# Patient Record
Sex: Female | Born: 1980
Health system: Southern US, Community
[De-identification: ages and names within clinical notes are randomized; demographics above are authoritative.]

## PROBLEM LIST (undated history)

## (undated) DIAGNOSIS — Z8742 Personal history of other diseases of the female genital tract: Secondary | ICD-10-CM

## (undated) DIAGNOSIS — IMO0002 Reserved for concepts with insufficient information to code with codable children: Secondary | ICD-10-CM

## (undated) DIAGNOSIS — D649 Anemia, unspecified: Secondary | ICD-10-CM

## (undated) DIAGNOSIS — C439 Malignant melanoma of skin, unspecified: Secondary | ICD-10-CM

## (undated) DIAGNOSIS — D259 Leiomyoma of uterus, unspecified: Secondary | ICD-10-CM

## (undated) DIAGNOSIS — R87619 Unspecified abnormal cytological findings in specimens from cervix uteri: Secondary | ICD-10-CM

## (undated) DIAGNOSIS — L309 Dermatitis, unspecified: Secondary | ICD-10-CM

## (undated) DIAGNOSIS — Z8619 Personal history of other infectious and parasitic diseases: Secondary | ICD-10-CM

## (undated) DIAGNOSIS — G43909 Migraine, unspecified, not intractable, without status migrainosus: Secondary | ICD-10-CM

## (undated) DIAGNOSIS — N9989 Other postprocedural complications and disorders of genitourinary system: Secondary | ICD-10-CM

## (undated) DIAGNOSIS — D500 Iron deficiency anemia secondary to blood loss (chronic): Secondary | ICD-10-CM

## (undated) DIAGNOSIS — D229 Melanocytic nevi, unspecified: Secondary | ICD-10-CM

## (undated) DIAGNOSIS — R51 Headache: Secondary | ICD-10-CM

## (undated) HISTORY — DX: Personal history of other infectious and parasitic diseases: Z86.19

## (undated) HISTORY — DX: Other postprocedural complications and disorders of genitourinary system: N99.89

## (undated) HISTORY — PX: ANKLE SURGERY: SHX546

## (undated) HISTORY — DX: Reserved for concepts with insufficient information to code with codable children: IMO0002

## (undated) HISTORY — DX: Unspecified abnormal cytological findings in specimens from cervix uteri: R87.619

## (undated) HISTORY — DX: Headache: R51

## (undated) HISTORY — PX: DILATION AND CURETTAGE OF UTERUS: SHX78

## (undated) HISTORY — PX: OTHER SURGICAL HISTORY: SHX169

## (undated) HISTORY — DX: Anemia, unspecified: D64.9

---
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
## 1999-11-06 ENCOUNTER — Emergency Department (HOSPITAL_COMMUNITY): Admission: EM | Admit: 1999-11-06 | Discharge: 1999-11-06 | Payer: Self-pay | Admitting: *Deleted

## 2000-02-16 ENCOUNTER — Other Ambulatory Visit: Admission: RE | Admit: 2000-02-16 | Discharge: 2000-02-16 | Payer: Self-pay | Admitting: Obstetrics and Gynecology

## 2000-05-22 ENCOUNTER — Other Ambulatory Visit: Admission: RE | Admit: 2000-05-22 | Discharge: 2000-05-22 | Payer: Self-pay | Admitting: Obstetrics and Gynecology

## 2001-04-02 ENCOUNTER — Other Ambulatory Visit: Admission: RE | Admit: 2001-04-02 | Discharge: 2001-04-02 | Payer: Self-pay | Admitting: Obstetrics and Gynecology

## 2003-08-17 ENCOUNTER — Other Ambulatory Visit: Admission: RE | Admit: 2003-08-17 | Discharge: 2003-08-17 | Payer: Self-pay | Admitting: Obstetrics and Gynecology

## 2008-08-14 ENCOUNTER — Emergency Department (HOSPITAL_COMMUNITY): Admission: EM | Admit: 2008-08-14 | Discharge: 2008-08-14 | Payer: Self-pay | Admitting: Emergency Medicine

## 2008-08-14 HISTORY — PX: ANKLE SURGERY: SHX546

## 2008-11-12 HISTORY — PX: DILATION AND EVACUATION: SHX1459

## 2009-06-10 ENCOUNTER — Ambulatory Visit (HOSPITAL_COMMUNITY): Admission: RE | Admit: 2009-06-10 | Discharge: 2009-06-10 | Payer: Self-pay | Admitting: Obstetrics

## 2009-06-17 HISTORY — PX: DILATION AND EVACUATION: SHX1459

## 2009-06-19 ENCOUNTER — Ambulatory Visit (HOSPITAL_COMMUNITY): Admission: RE | Admit: 2009-06-19 | Discharge: 2009-06-19 | Payer: Self-pay | Admitting: Obstetrics

## 2009-06-29 ENCOUNTER — Ambulatory Visit (HOSPITAL_COMMUNITY): Admission: RE | Admit: 2009-06-29 | Discharge: 2009-06-29 | Payer: Self-pay | Admitting: Obstetrics

## 2009-11-26 ENCOUNTER — Inpatient Hospital Stay (HOSPITAL_COMMUNITY): Admission: AD | Admit: 2009-11-26 | Discharge: 2009-11-27 | Payer: Self-pay | Admitting: Obstetrics & Gynecology

## 2009-12-22 ENCOUNTER — Ambulatory Visit: Payer: Self-pay | Admitting: Advanced Practice Midwife

## 2009-12-22 ENCOUNTER — Inpatient Hospital Stay (HOSPITAL_COMMUNITY): Admission: AD | Admit: 2009-12-22 | Discharge: 2009-12-23 | Payer: Self-pay | Admitting: Obstetrics and Gynecology

## 2010-07-15 ENCOUNTER — Inpatient Hospital Stay (HOSPITAL_COMMUNITY): Admission: AD | Admit: 2010-07-15 | Discharge: 2010-07-15 | Payer: Self-pay | Admitting: Obstetrics

## 2010-07-15 ENCOUNTER — Ambulatory Visit: Payer: Self-pay | Admitting: Gynecology

## 2010-07-15 DIAGNOSIS — O479 False labor, unspecified: Secondary | ICD-10-CM

## 2010-07-16 ENCOUNTER — Inpatient Hospital Stay (HOSPITAL_COMMUNITY): Admission: AD | Admit: 2010-07-16 | Discharge: 2010-07-19 | Payer: Self-pay | Admitting: Obstetrics

## 2010-07-16 ENCOUNTER — Inpatient Hospital Stay (HOSPITAL_COMMUNITY): Admission: AD | Admit: 2010-07-16 | Discharge: 2010-07-16 | Payer: Self-pay | Admitting: Obstetrics and Gynecology

## 2010-07-23 ENCOUNTER — Ambulatory Visit: Payer: Self-pay | Admitting: Family

## 2010-07-23 ENCOUNTER — Inpatient Hospital Stay (HOSPITAL_COMMUNITY): Admission: AD | Admit: 2010-07-23 | Discharge: 2010-07-23 | Payer: Self-pay | Admitting: Obstetrics

## 2010-07-26 ENCOUNTER — Ambulatory Visit: Admission: RE | Admit: 2010-07-26 | Discharge: 2010-07-26 | Payer: Self-pay | Admitting: Obstetrics

## 2010-09-22 DIAGNOSIS — D229 Melanocytic nevi, unspecified: Secondary | ICD-10-CM

## 2010-09-22 HISTORY — DX: Melanocytic nevi, unspecified: D22.9

## 2011-01-25 LAB — CBC
HCT: 34.5 % — ABNORMAL LOW (ref 36.0–46.0)
HCT: 38.7 % (ref 36.0–46.0)
Hemoglobin: 10.3 g/dL — ABNORMAL LOW (ref 12.0–15.0)
Hemoglobin: 12 g/dL (ref 12.0–15.0)
Hemoglobin: 13.4 g/dL (ref 12.0–15.0)
MCHC: 34.8 g/dL (ref 30.0–36.0)
MCV: 91.8 fL (ref 78.0–100.0)
Platelets: 176 10*3/uL (ref 150–400)
RBC: 3.18 MIL/uL — ABNORMAL LOW (ref 3.87–5.11)
RDW: 14.1 % (ref 11.5–15.5)
WBC: 10.4 10*3/uL (ref 4.0–10.5)

## 2011-01-25 LAB — RPR: RPR Ser Ql: NONREACTIVE

## 2011-01-25 LAB — URINALYSIS, ROUTINE W REFLEX MICROSCOPIC
Bilirubin Urine: NEGATIVE
Glucose, UA: NEGATIVE mg/dL
Ketones, ur: NEGATIVE mg/dL
Nitrite: NEGATIVE
Specific Gravity, Urine: 1.015 (ref 1.005–1.030)
pH: 6.5 (ref 5.0–8.0)

## 2011-01-25 LAB — URINE MICROSCOPIC-ADD ON

## 2011-01-25 LAB — TYPE AND SCREEN
ABO/RH(D): A POS
Antibody Screen: NEGATIVE

## 2011-01-28 LAB — CBC
HCT: 38.5 % (ref 36.0–46.0)
Hemoglobin: 13.1 g/dL (ref 12.0–15.0)
MCHC: 34.1 g/dL (ref 30.0–36.0)
RBC: 4.13 MIL/uL (ref 3.87–5.11)
RDW: 13.4 % (ref 11.5–15.5)

## 2011-01-28 LAB — URINALYSIS, ROUTINE W REFLEX MICROSCOPIC
Glucose, UA: NEGATIVE mg/dL
Hgb urine dipstick: NEGATIVE
Ketones, ur: 15 mg/dL — AB
Protein, ur: NEGATIVE mg/dL
Urobilinogen, UA: 0.2 mg/dL (ref 0.0–1.0)

## 2011-11-13 NOTE — L&D Delivery Note (Signed)
Delivery Note At 6:47 PM a viable female was delivered via Vaginal, Spontaneous Delivery (Presentation: ; DOA ). Head delivered, nuchal x 1 noted, loose but unable to be reduced. Cord clamped x 2 and cut. Shoulders delivered easily APGAR: 8/9 , ; weight .  pending Placenta status: spontaneous, intace , .  Cord:  3VCwith the following complications: none.  Cord pH: not indicated  Anesthesia: Epidural  Episiotomy: noine Lacerations: none. Small periurethral abrasion and hematoma, approaching but not affecting urethral meatus Suture Repair: none Est. Blood Loss (mL):   Mom to postpartum.  Baby to nursery-stable Mom w/ h/o PPH secondary to urinary retention, will keep close watch on Uout and plan foley if not voiding well.  Ande Therrell A. 07/21/2012, 7:00 PM

## 2011-12-18 LAB — OB RESULTS CONSOLE ABO/RH

## 2011-12-18 LAB — OB RESULTS CONSOLE HIV ANTIBODY (ROUTINE TESTING): HIV: NONREACTIVE

## 2011-12-18 LAB — OB RESULTS CONSOLE HEPATITIS B SURFACE ANTIGEN: Hepatitis B Surface Ag: NEGATIVE

## 2011-12-20 LAB — OB RESULTS CONSOLE GC/CHLAMYDIA: Chlamydia: NEGATIVE

## 2012-06-30 LAB — OB RESULTS CONSOLE GBS: GBS: NEGATIVE

## 2012-07-16 ENCOUNTER — Telehealth (HOSPITAL_COMMUNITY): Payer: Self-pay | Admitting: *Deleted

## 2012-07-16 ENCOUNTER — Encounter (HOSPITAL_COMMUNITY): Payer: Self-pay | Admitting: *Deleted

## 2012-07-16 NOTE — Telephone Encounter (Signed)
Preadmission screen  

## 2012-07-17 ENCOUNTER — Other Ambulatory Visit: Payer: Self-pay | Admitting: Obstetrics

## 2012-07-18 ENCOUNTER — Encounter (HOSPITAL_COMMUNITY): Payer: Self-pay | Admitting: *Deleted

## 2012-07-18 ENCOUNTER — Inpatient Hospital Stay (HOSPITAL_COMMUNITY)
Admission: AD | Admit: 2012-07-18 | Discharge: 2012-07-18 | Disposition: A | Discharge status: Home / Self Care | Payer: BC Managed Care – PPO | Source: Ambulatory Visit | Attending: Obstetrics and Gynecology | Admitting: Obstetrics and Gynecology

## 2012-07-18 DIAGNOSIS — O479 False labor, unspecified: Secondary | ICD-10-CM | POA: Insufficient documentation

## 2012-07-18 NOTE — MAU Note (Signed)
Contractions q 5 minutes 

## 2012-07-18 NOTE — Progress Notes (Signed)
Pt states she has a headache that she rates a 4 

## 2012-07-18 NOTE — Progress Notes (Signed)
Dr Cherly Hensen informed of pt's presence on the unit. VE exam. Order recieved

## 2012-07-21 ENCOUNTER — Inpatient Hospital Stay (HOSPITAL_COMMUNITY): Payer: BC Managed Care – PPO | Admitting: Anesthesiology

## 2012-07-21 ENCOUNTER — Encounter (HOSPITAL_COMMUNITY): Payer: Self-pay | Admitting: Anesthesiology

## 2012-07-21 ENCOUNTER — Encounter (HOSPITAL_COMMUNITY): Payer: Self-pay

## 2012-07-21 ENCOUNTER — Inpatient Hospital Stay (HOSPITAL_COMMUNITY)
Admission: RE | Admit: 2012-07-21 | Discharge: 2012-07-23 | DRG: 373 | Disposition: A | Discharge status: Home / Self Care | Payer: BC Managed Care – PPO | Source: Ambulatory Visit | Attending: Obstetrics | Admitting: Obstetrics

## 2012-07-21 DIAGNOSIS — O480 Post-term pregnancy: Principal | ICD-10-CM | POA: Diagnosis present

## 2012-07-21 LAB — CBC
HCT: 36.3 % (ref 36.0–46.0)
MCHC: 33.3 g/dL (ref 30.0–36.0)
Platelets: 199 10*3/uL (ref 150–400)
RDW: 14.2 % (ref 11.5–15.5)
WBC: 9.2 10*3/uL (ref 4.0–10.5)

## 2012-07-21 LAB — TYPE AND SCREEN
ABO/RH(D): A POS
Antibody Screen: NEGATIVE

## 2012-07-21 MED ORDER — TERBUTALINE SULFATE 1 MG/ML IJ SOLN: 0.2500 mg | Freq: Once | INTRAMUSCULAR | Status: DC | PRN

## 2012-07-21 MED ORDER — DIBUCAINE 1 % RE OINT: 1.0000 "application " | TOPICAL_OINTMENT | RECTAL | Status: DC | PRN

## 2012-07-21 MED ORDER — PHENYLEPHRINE 40 MCG/ML (10ML) SYRINGE FOR IV PUSH (FOR BLOOD PRESSURE SUPPORT): 80.0000 ug | PREFILLED_SYRINGE | INTRAVENOUS | Status: DC | PRN

## 2012-07-21 MED ORDER — SENNOSIDES-DOCUSATE SODIUM 8.6-50 MG PO TABS
2.0000 | ORAL_TABLET | Freq: Every day | ORAL | Status: DC
  Administered 2012-07-21 – 2012-07-22 (×2): 2 via ORAL

## 2012-07-21 MED ORDER — ONDANSETRON HCL 4 MG PO TABS: 4.0000 mg | ORAL_TABLET | ORAL | Status: DC | PRN

## 2012-07-21 MED ORDER — WITCH HAZEL-GLYCERIN EX PADS: 1.0000 "application " | MEDICATED_PAD | CUTANEOUS | Status: DC | PRN

## 2012-07-21 MED ORDER — LIDOCAINE HCL (PF) 1 % IJ SOLN: INTRAMUSCULAR | Status: DC | PRN

## 2012-07-21 MED ORDER — BISACODYL 10 MG RE SUPP: 10.0000 mg | Freq: Every day | RECTAL | Status: DC | PRN

## 2012-07-21 MED ORDER — LACTATED RINGERS IV SOLN
INTRAVENOUS | Status: DC
  Administered 2012-07-21 (×3): via INTRAVENOUS

## 2012-07-21 MED ORDER — PHENYLEPHRINE 40 MCG/ML (10ML) SYRINGE FOR IV PUSH (FOR BLOOD PRESSURE SUPPORT)
80.0000 ug | PREFILLED_SYRINGE | INTRAVENOUS | Status: DC | PRN
  Filled 2012-07-21: qty 5

## 2012-07-21 MED ORDER — OXYTOCIN 40 UNITS IN LACTATED RINGERS INFUSION - SIMPLE MED
62.5000 mL/h | Freq: Once | INTRAVENOUS | Status: AC
  Administered 2012-07-21: 62.5 mL/h via INTRAVENOUS
  Filled 2012-07-21: qty 1000

## 2012-07-21 MED ORDER — IBUPROFEN 600 MG PO TABS
600.0000 mg | ORAL_TABLET | Freq: Four times a day (QID) | ORAL | Status: DC
  Administered 2012-07-21 – 2012-07-22 (×2): 600 mg via ORAL
  Filled 2012-07-21 (×2): qty 1

## 2012-07-21 MED ORDER — EPHEDRINE 5 MG/ML INJ
10.0000 mg | INTRAVENOUS | Status: DC | PRN
  Filled 2012-07-21: qty 4

## 2012-07-21 MED ORDER — LIDOCAINE HCL (PF) 1 % IJ SOLN
INTRAMUSCULAR | Status: DC | PRN
  Administered 2012-07-21 (×2): 9 mL

## 2012-07-21 MED ORDER — ONDANSETRON HCL 4 MG/2ML IJ SOLN: 4.0000 mg | Freq: Four times a day (QID) | INTRAMUSCULAR | Status: DC | PRN

## 2012-07-21 MED ORDER — OXYTOCIN BOLUS FROM INFUSION
500.0000 mL | Freq: Once | INTRAVENOUS | Status: DC
  Filled 2012-07-21: qty 500

## 2012-07-21 MED ORDER — TETANUS-DIPHTH-ACELL PERTUSSIS 5-2.5-18.5 LF-MCG/0.5 IM SUSP: 0.5000 mL | Freq: Once | INTRAMUSCULAR | Status: DC

## 2012-07-21 MED ORDER — LACTATED RINGERS IV SOLN: 500.0000 mL | INTRAVENOUS | Status: DC | PRN

## 2012-07-21 MED ORDER — IBUPROFEN 600 MG PO TABS: 600.0000 mg | ORAL_TABLET | Freq: Four times a day (QID) | ORAL | Status: DC | PRN

## 2012-07-21 MED ORDER — ONDANSETRON HCL 4 MG/2ML IJ SOLN: 4.0000 mg | INTRAMUSCULAR | Status: DC | PRN

## 2012-07-21 MED ORDER — OXYTOCIN 40 UNITS IN LACTATED RINGERS INFUSION - SIMPLE MED
1.0000 m[IU]/min | INTRAVENOUS | Status: DC
  Administered 2012-07-21: 2 m[IU]/min via INTRAVENOUS
  Filled 2012-07-21: qty 1000

## 2012-07-21 MED ORDER — LANOLIN HYDROUS EX OINT: TOPICAL_OINTMENT | CUTANEOUS | Status: DC | PRN

## 2012-07-21 MED ORDER — LIDOCAINE HCL (PF) 1 % IJ SOLN
30.0000 mL | INTRAMUSCULAR | Status: DC | PRN
  Filled 2012-07-21: qty 30

## 2012-07-21 MED ORDER — CITRIC ACID-SODIUM CITRATE 334-500 MG/5ML PO SOLN: 30.0000 mL | ORAL | Status: DC | PRN

## 2012-07-21 MED ORDER — SIMETHICONE 80 MG PO CHEW: 80.0000 mg | CHEWABLE_TABLET | ORAL | Status: DC | PRN

## 2012-07-21 MED ORDER — OXYTOCIN 40 UNITS IN LACTATED RINGERS INFUSION - SIMPLE MED: 62.5000 mL/h | INTRAVENOUS | Status: DC

## 2012-07-21 MED ORDER — DIPHENHYDRAMINE HCL 50 MG/ML IJ SOLN: 12.5000 mg | INTRAMUSCULAR | Status: DC | PRN

## 2012-07-21 MED ORDER — DIPHENHYDRAMINE HCL 25 MG PO CAPS: 25.0000 mg | ORAL_CAPSULE | Freq: Four times a day (QID) | ORAL | Status: DC | PRN

## 2012-07-21 MED ORDER — OXYCODONE-ACETAMINOPHEN 5-325 MG PO TABS: 1.0000 | ORAL_TABLET | ORAL | Status: DC | PRN

## 2012-07-21 MED ORDER — ACETAMINOPHEN 325 MG PO TABS: 650.0000 mg | ORAL_TABLET | ORAL | Status: DC | PRN

## 2012-07-21 MED ORDER — FLEET ENEMA 7-19 GM/118ML RE ENEM: 1.0000 | ENEMA | Freq: Every day | RECTAL | Status: DC | PRN

## 2012-07-21 MED ORDER — FENTANYL 2.5 MCG/ML BUPIVACAINE 1/10 % EPIDURAL INFUSION (WH - ANES)
INTRAMUSCULAR | Status: DC | PRN
  Administered 2012-07-21: 14 mL/h via EPIDURAL

## 2012-07-21 MED ORDER — ZOLPIDEM TARTRATE 5 MG PO TABS: 5.0000 mg | ORAL_TABLET | Freq: Every evening | ORAL | Status: DC | PRN

## 2012-07-21 MED ORDER — BENZOCAINE-MENTHOL 20-0.5 % EX AERO
1.0000 "application " | INHALATION_SPRAY | CUTANEOUS | Status: DC | PRN
  Filled 2012-07-21: qty 56

## 2012-07-21 MED ORDER — FLEET ENEMA 7-19 GM/118ML RE ENEM: 1.0000 | ENEMA | RECTAL | Status: DC | PRN

## 2012-07-21 MED ORDER — PRENATAL MULTIVITAMIN CH
1.0000 | ORAL_TABLET | Freq: Every day | ORAL | Status: DC
  Administered 2012-07-22 – 2012-07-23 (×2): 1 via ORAL
  Filled 2012-07-21 (×2): qty 1

## 2012-07-21 MED ORDER — FENTANYL 2.5 MCG/ML BUPIVACAINE 1/10 % EPIDURAL INFUSION (WH - ANES)
14.0000 mL/h | INTRAMUSCULAR | Status: DC
  Administered 2012-07-21 (×2): 14 mL/h via EPIDURAL
  Filled 2012-07-21 (×3): qty 60

## 2012-07-21 MED ORDER — EPHEDRINE 5 MG/ML INJ: 10.0000 mg | INTRAVENOUS | Status: DC | PRN

## 2012-07-21 MED ORDER — LACTATED RINGERS IV SOLN: 500.0000 mL | Freq: Once | INTRAVENOUS | Status: DC

## 2012-07-21 NOTE — Anesthesia Preprocedure Evaluation (Signed)
Anesthesia Evaluation  Patient identified by MRN, date of birth, ID band Patient awake    Reviewed: Allergy & Precautions, H&P , NPO status , Patient's Chart, lab work & pertinent test results  Airway Mallampati: I TM Distance: >3 FB Neck ROM: full    Dental No notable dental hx.    Pulmonary neg pulmonary ROS,  breath sounds clear to auscultation  Pulmonary exam normal       Cardiovascular negative cardio ROS      Neuro/Psych negative psych ROS   GI/Hepatic negative GI ROS, Neg liver ROS,   Endo/Other  negative endocrine ROS  Renal/GU negative Renal ROS  negative genitourinary   Musculoskeletal negative musculoskeletal ROS (+)   Abdominal Normal abdominal exam  (+)   Peds  Hematology negative hematology ROS (+)   Anesthesia Other Findings   Reproductive/Obstetrics (+) Pregnancy                           Anesthesia Physical Anesthesia Plan  ASA: II  Anesthesia Plan: Epidural   Post-op Pain Management:    Induction:   Airway Management Planned:   Additional Equipment:   Intra-op Plan:   Post-operative Plan:   Informed Consent: I have reviewed the patients History and Physical, chart, labs and discussed the procedure including the risks, benefits and alternatives for the proposed anesthesia with the patient or authorized representative who has indicated his/her understanding and acceptance.     Plan Discussed with:   Anesthesia Plan Comments:         Anesthesia Quick Evaluation

## 2012-07-21 NOTE — Anesthesia Procedure Notes (Signed)
Epidural Patient location during procedure: OB Start time: 07/21/2012 1:53 PM End time: 07/21/2012 1:57 PM  Staffing Anesthesiologist: Sandrea Hughs Performed by: anesthesiologist   Preanesthetic Checklist Completed: patient identified, site marked, surgical consent, pre-op evaluation, timeout performed, IV checked, risks and benefits discussed and monitors and equipment checked  Epidural Patient position: sitting Prep: site prepped and draped and DuraPrep Patient monitoring: continuous pulse ox and blood pressure Approach: midline Injection technique: LOR air  Needle:  Needle type: Tuohy  Needle gauge: 17 G Needle length: 9 cm and 9 Needle insertion depth: 5 cm cm Catheter type: closed end flexible Catheter size: 19 Gauge Catheter at skin depth: 10 cm Test dose: negative and Other  Assessment Sensory level: T10 Events: blood not aspirated, injection not painful, no injection resistance, negative IV test and no paresthesia  Additional Notes Reason for block:procedure for pain

## 2012-07-21 NOTE — Progress Notes (Signed)
Feeling ctx but not painful  PE: Gen: well appearing Tocoo; q 3 min, pitocin FH: 130's, + accels, no decels, 10 beat var Cvx: 3/50%/ vtx -3 but engaged, post  AROM- clear.  A/P: G4P1 at 40'3 for IOL - pitocin on, cont pit. AROM now - reactive fetal testing - planning epidural  Lindsey Palmer A. 07/21/2012 1:15 PM

## 2012-07-21 NOTE — Progress Notes (Signed)
S: Doing well, no complaints, pain well controlled with epidural, though can still feel some ctx  O: BP 136/82  Pulse 95  Temp 97.8 F (36.6 C) (Axillary)  Resp 16  Ht 6' (1.829 m)  Wt 92.987 kg (205 lb)  BMI 27.80 kg/m2  SpO2 98%  LMP 10/12/2011   FHT:  FHR: 130s bpm, variability: moderate,  accelerations:  Present,  decelerations:  Absent UC:   regular, every 4 minutes SVE:   Dilation: 3.5 Effacement (%): 60 Station: -2 Exam by:: E. [facility]one RNC   A / P:  31 y.o.  Obstetric History   G4   P1   T1   P0   A2   TAB0   SAB2   E0   M0   L1    at [redacted]w[redacted]d Induction of labor due to postterm,  progressing well on pitocin. Slower but adequate progress, pt w/ change in cervical position more than dilation, cont to increase pitocin  Fetal Wellbeing:  Category I Pain Control:  Epidural  Anticipated MOD:  NSVD  Freeman Borba A. 07/21/2012, 4:25 PM

## 2012-07-21 NOTE — H&P (Signed)
Lindsey Palmer is a 31 y.o. [id number] at [redacted]w[redacted]d presenting for IOL. Pt notes rare contractions. Good fetal movement, No vaginal bleeding, not leaking fluid.  [facility] at [facility] since 1st trimester. - increased wt gain- 46# -thin PCOS, conceived w/ Clomid/ metformin- stopped at 12 wks - h/o MAB x 2  OB History    Grav Para Term Preterm Abortions TAB SAB Ect Mult Living   4 1 1  2  2   1     SAB x 2 SVD, IOL, 41 wks, 7'15  Past Medical History  Diagnosis Date  . Abnormal Pap smear   . Headache   . Urinary complications   . H/O varicella   migraine  Past Surgical History  Procedure Date  . Ankle surgery   . Dilation and curettage of uterus    Family History: family history includes Cancer in her paternal grandfather and Migraines in her brother and mother. Social History:  reports that she has never smoked. She has never used smokeless tobacco. She reports that she drinks alcohol. She reports that she does not use illicit drugs.  Review of Systems - Negative except rare ctx  NKDA Meds: PNV, Dulolax   Last menstrual period 10/12/2011.  Physical Exam:  Gen: well appearing, no distress CV: RRR Pulm: CTAB Back: no CVAT Abd: gravid, NT, no RUQ pain LE: tr edema, equal bilaterally, non-tender EFW: 8# Toco: rare FH: baseline 130s, accelerations present, no deceleratons, 10 beat variability  Prenatal labs: ABO, Rh: A/Positive/-- (02/05 0000) Antibody:  neg Rubella:  immune RPR: Nonreactive (02/05 0000)  HBsAg: Negative (02/05 0000)  HIV: Non-reactive (02/05 0000)  GBS: Negative (08/19 0000)  1 hr Glucola 85  Genetic screening nl Anatomy US nl  Labs pending  Assessment/Plan: 31 y.o. [id number] at [redacted]w[redacted]d Reactive fetal testing IOL. Plan pitocin now, AROM when head more engaged   Juwuan Sedita A. 07/21/2012, 7:38 AM

## 2012-07-22 LAB — CBC
MCH: 29.6 pg (ref 26.0–34.0)
MCV: 88.5 fL (ref 78.0–100.0)
Platelets: 180 10*3/uL (ref 150–400)
RBC: 3.82 MIL/uL — ABNORMAL LOW (ref 3.87–5.11)
RDW: 14.1 % (ref 11.5–15.5)
WBC: 11.5 10*3/uL — ABNORMAL HIGH (ref 4.0–10.5)

## 2012-07-22 MED ORDER — HYDROMORPHONE HCL 2 MG PO TABS
1.0000 mg | ORAL_TABLET | ORAL | Status: DC | PRN
  Administered 2012-07-22 – 2012-07-23 (×5): 1 mg via ORAL
  Filled 2012-07-22 (×5): qty 1

## 2012-07-22 MED ORDER — IBUPROFEN 800 MG PO TABS
800.0000 mg | ORAL_TABLET | Freq: Three times a day (TID) | ORAL | Status: DC
  Administered 2012-07-22 – 2012-07-23 (×3): 800 mg via ORAL
  Filled 2012-07-22 (×4): qty 1

## 2012-07-22 NOTE — Addendum Note (Signed)
Addendum  created 07/22/12 1312 by Algis Greenhouse, CRNA   Modules edited:Charges VN, Notes Section

## 2012-07-22 NOTE — Addendum Note (Signed)
Addendum  created 07/22/12 1312 by Landy Mace A Matayah Reyburn, CRNA   Modules edited:Charges VN, Notes Section    

## 2012-07-22 NOTE — Progress Notes (Signed)
PPD 1 SVD  S:  Reports feeling well but having moderate cramping- pain              Tolerating po/ No nausea or vomiting             Bleeding is light             Pain controlled withprescription NSAID's including  motrin - percocet not effective / request dilaudid             Up ad lib / ambulatory / voiding QS (reminded to empty bladder every 2-4 hours while awake)  Newborn breast-feeding  / No circ   O:  A & O x 3 NAd             VS: Blood pressure 117/74, pulse 59, temperature 97.7 F (36.5 C), temperature source Oral, resp. rate 18, height 6' (1.829 m), weight 92.987 kg (205 lb), last menstrual period 10/12/2011, SpO2 95.00%, unknown if currently breastfeeding.  LABS: WBC/Hgb/Hct/Plts:  11.5/11.3/33.8/180 (09/10 0525)   Lungs: Clear and unlabored  Heart: regular rate and rhythm  Abdomen: soft, non-tender, non-distended              Fundus: firm, non-tender, U-1  Perineum: no edema / ice pack in place  Lochia: light  Extremities: no edema, no calf pain or tenderness    A: PPD # 1 SVD -intact              Ineffective pain control with percocet  Doing well - stable status  P:  Routine post partum orders             Change pain medication  Anticipate discharge tomorrow  Marlinda Mike CNM, MSN 07/22/2012, 9:23 AM

## 2012-07-22 NOTE — Anesthesia Postprocedure Evaluation (Signed)
Anesthesia Post Note  Patient: Lindsey Palmer  Procedure(s) Performed: * No procedures listed *  Anesthesia type: Epidural  Patient location: Mother/Baby  Post pain: Pain level controlled  Post assessment: Post-op Vital signs reviewed  Last Vitals:  Filed Vitals:   07/22/12 0900  BP: 123/69  Pulse: 80  Temp: 36.8 C  Resp: 20    Post vital signs: Reviewed  Level of consciousness:alert  Complications: No apparent anesthesia complications

## 2012-07-23 MED ORDER — HYDROMORPHONE HCL 2 MG PO TABS: 1.0000 mg | ORAL_TABLET | ORAL | Status: AC | PRN

## 2012-07-23 MED ORDER — IBUPROFEN 800 MG PO TABS: 800.0000 mg | ORAL_TABLET | Freq: Three times a day (TID) | ORAL | Status: AC

## 2012-07-23 NOTE — Progress Notes (Signed)
Post Partum Day #2            Information for the patient's newborn:  Jannie, Larrick [[id number]]  female   / circumcision declined Feeding: breast  Subjective: No HA, SOB, CP, F/C, breast symptoms. Perineal pain controlled w/ Motrin and Dilaudid. Normal vaginal bleeding, no clots.      Objective:  Temp:  [97.6 F (36.4 C)-98.2 F (36.8 C)] 97.6 F (36.4 C) (09/11 0535) Pulse Rate:  [62-87] 62  (09/11 0535) Resp:  [18-20] 18  (09/11 0535) BP: (119-127)/(78-83) 127/83 mmHg (09/11 0535)   Intake/Output Summary (Last 24 hours) at 07/23/12 1016 Last data filed at 07/22/12 1400  Gross per 24 hour  Intake    750 ml  Output   1000 ml  Net   -250 ml       Basename 07/22/12 0525 07/21/12 0800  WBC 11.5* 9.2  HGB 11.3* 12.1  HCT 33.8* 36.3  PLT 180 199    Blood type: --/--/A POS (09/09 0800) Rubella: Immune (02/05 0000)    Physical Exam:  General: alert, cooperative and no distress Uterine Fundus: firm Lochia: appropriate Perineum: intact, edema minimal DVT Evaluation: No evidence of DVT seen on physical exam. No significant calf/ankle edema.    Assessment/Plan: PPD # 2 / 31 y.o., [id number] S/P:induced vaginal   Active Problems:  Postpartum care following vaginal delivery - intact (9/9)    normal postpartum exam  Continue current postpartum care  D/C home   LOS: 2 days   PAUL,DANIELA, CNM, MSN 07/23/2012, 10:16 AM

## 2012-07-23 NOTE — Discharge Summary (Signed)
Obstetric Discharge Summary Reason for Admission: induction of labor and post dates Prenatal Procedures: ultrasound Intrapartum Procedures: spontaneous vaginal delivery and epidural Postpartum Procedures: TDaP vaccine Complications-Operative and Postpartum: none Hemoglobin  Date Value Range Status  07/22/2012 11.3* 12.0 - 15.0 g/dL Final     HCT  Date Value Range Status  07/22/2012 33.8* 36.0 - 46.0 % Final    Physical Exam:  General: alert, cooperative and no distress Lochia: appropriate Uterine Fundus: firm Incision: NA DVT Evaluation: Negative Homan's sign. No significant calf/ankle edema.  Discharge Diagnoses: Term Pregnancy-delivered  Discharge Information: Date: 07/23/2012 Activity: pelvic rest Diet: routine Medications: PNV, Ibuprofen and Dilaudid Condition: stable Instructions: refer to practice specific booklet Discharge to: home Follow-up Information    Follow up with [facility] A., MD. Schedule an appointment as soon as possible for a visit in 6 weeks.   Contact information:   [address] [place] [address] [phone]          Newborn Data: Live born female  Birth Weight: 9 lb 2.6 oz (4156 g) APGAR: 8, 9  Home with mother.  Danisa Kopec 07/23/2012, 10:34 AM

## 2014-09-13 ENCOUNTER — Encounter (HOSPITAL_COMMUNITY): Payer: Self-pay

## 2014-11-12 DIAGNOSIS — Z85820 Personal history of malignant melanoma of skin: Secondary | ICD-10-CM

## 2014-11-12 HISTORY — DX: Personal history of malignant melanoma of skin: Z85.820

## 2015-09-08 DIAGNOSIS — C439 Malignant melanoma of skin, unspecified: Secondary | ICD-10-CM

## 2015-09-08 HISTORY — DX: Malignant melanoma of skin, unspecified: C43.9

## 2015-09-16 ENCOUNTER — Other Ambulatory Visit (HOSPITAL_COMMUNITY): Payer: Self-pay | Admitting: Surgery

## 2015-09-16 ENCOUNTER — Ambulatory Visit: Payer: Self-pay | Admitting: Surgery

## 2015-09-16 DIAGNOSIS — C4359 Malignant melanoma of other part of trunk: Secondary | ICD-10-CM

## 2015-09-16 DIAGNOSIS — C439 Malignant melanoma of skin, unspecified: Secondary | ICD-10-CM

## 2015-09-16 NOTE — H&P (Signed)
Lindsey Palmer 09/16/2015 9:40 AM Location: [facility] Patient #: [id number] DOB: Apr 23, 1981 Married / Language: Lindsey Palmer / Race: White Female  History of Present Illness Lindsey Moores A. Helmut Hennon Palmer; 09/16/2015 11:52 AM) Patient words: melanoma - back   Patient sent at the request of Dr. Allyson Palmer for melanoma over left central back. Her husband relate some mole that was changing in appearance last few months. She underwent shave biopsy of this which showed a level 4 1.04 mm thickness superficial spreading melanoma over the left central back just off the midline. She had dysplastic nevus shaved off her right hip as well. She denies any ulceration of these areas but according to her husband who became darker and more irregular over the last couple of months.  The patient is a 34 year old female.   Other Problems Lindsey Palmer, Lindsey Palmer; 09/16/2015 9:41 AM) Back Pain Melanoma Migraine Headache  Past Surgical History Lindsey Palmer, [place]; 09/16/2015 9:41 AM) Foot Surgery Left.  Diagnostic Studies History Lindsey Palmer, Lindsey Palmer; 09/16/2015 9:41 AM) Colonoscopy never Mammogram never Pap Smear 1-5 years ago  Allergies Lindsey Palmer, [place]; 09/16/2015 9:41 AM) No Known Drug Allergies 09/16/2015  Medication History Lindsey Palmer, Lindsey Palmer; 09/16/2015 9:41 AM) No Current Medications Medications Reconciled  Social History Lindsey Palmer, Lindsey Palmer; 09/16/2015 9:41 AM) Alcohol use Occasional alcohol use. Caffeine use Coffee. No drug use Tobacco use Never smoker.  Family History Lindsey Palmer, Lindsey Palmer; 09/16/2015 9:41 AM) Migraine Headache Mother.  Pregnancy / Birth History Lindsey Palmer, Lindsey Palmer; 09/16/2015 9:41 AM) Age at menarche 54 years. Gravida 4 Maternal age 45-30 Para 2 Regular periods     Review of Systems Lindsey Palmer CMA; 09/16/2015 9:41 AM) General Present- Fatigue. Not Present- Appetite Loss, Chills, Fever, Night Sweats,  Weight Gain and Weight Loss. Skin Present- Change in Wart/Mole, Dryness and Rash. Not Present- Hives, Jaundice, New Lesions, Non-Healing Wounds and Ulcer. HEENT Present- Wears glasses/contact lenses. Not Present- Earache, Hearing Loss, Hoarseness, Nose Bleed, Oral Ulcers, Ringing in the Ears, Seasonal Allergies, Sinus Pain, Sore Throat, Visual Disturbances and Yellow Eyes. Respiratory Not Present- Bloody sputum, Chronic Cough, Difficulty Breathing, Snoring and Wheezing. Breast Not Present- Breast Mass, Breast Pain, Nipple Discharge and Skin Changes. Cardiovascular Present- Chest Pain. Not Present- Difficulty Breathing Lying Down, Leg Cramps, Palpitations, Rapid Heart Rate, Shortness of Breath and Swelling of Extremities. Gastrointestinal Not Present- Abdominal Pain, Bloating, Bloody Stool, Change in Bowel Habits, Chronic diarrhea, Constipation, Difficulty Swallowing, Excessive gas, Gets full quickly at meals, Hemorrhoids, Indigestion, Nausea, Rectal Pain and Vomiting. Female Genitourinary Not Present- Frequency, Nocturia, Painful Urination, Pelvic Pain and Urgency. Musculoskeletal Not Present- Back Pain, Joint Pain, Joint Stiffness, Muscle Pain, Muscle Weakness and Swelling of Extremities. Neurological Present- Headaches. Not Present- Decreased Memory, Fainting, Numbness, Seizures, Tingling, Tremor, Trouble walking and Weakness. Psychiatric Not Present- Anxiety, Bipolar, Change in Sleep Pattern, Depression, Fearful and Frequent crying. Endocrine Not Present- Cold Intolerance, Excessive Hunger, Hair Changes, Heat Intolerance, Hot flashes and New Diabetes. Hematology Not Present- Easy Bruising, Excessive bleeding, Gland problems, HIV and Persistent Infections.  Vitals Lindsey Palmer CMA; 09/16/2015 9:40 AM) 09/16/2015 9:40 AM Weight: 155.5 lb Height: 72in Body Surface Area: 1.91 m Body Mass Index: 21.09 kg/m  BP: 114/70 (Sitting, Left Arm, Standard)      Physical Exam (Lindsey Palmer A.  Lindsey Palmer; 09/16/2015 11:56 AM)  General Mental Status-Alert. General Appearance-Consistent with stated age. Hydration-Well hydrated. Voice-Normal.  Integumentary Note: 1cm x 1 cm wound left central back open clean  right hip 1 cm open clean wound   Chest and Lung Exam Chest and lung exam reveals -quiet, even and easy respiratory effort with no use of accessory muscles and on auscultation, normal breath sounds, no adventitious sounds and normal vocal resonance. Inspection Chest Wall - Normal. Back - normal.  Cardiovascular Cardiovascular examination reveals -normal heart sounds, regular rate and rhythm with no murmurs and normal pedal pulses bilaterally.  Neurologic Neurologic evaluation reveals -alert and oriented x 3 with no impairment of recent or remote memory. Mental Status-Normal.  Musculoskeletal Normal Exam - Left-Upper Extremity Strength Normal and Lower Extremity Strength Normal. Normal Exam - Right-Upper Extremity Strength Normal and Lower Extremity Strength Normal.  Lymphatic Axillary  General Axillary Region: Bilateral - Description - Normal. Tenderness - Non Tender. Femoral & Inguinal  Generalized Femoral & Inguinal Lymphatics: Bilateral - Description - Normal. Tenderness - Non Tender.    Assessment & Plan (Lindsey Palmer; 09/16/2015 11:58 AM)  MELANOMA OF BACK (C43.59) Impression: Risk of sentinel lymph node mapping include bleeding, infection, lymphedema, shoulder pain. stiffness, dye allergy. cosmetic deformity , blood clots, death, need for more surgery. Pt agres to proceed. Recommend wide local excision for 1 cm margin and sentinel lymph node mapping for left central back superficial spreading melanoma. Risk of bleeding, infection, cosmesis, nerve injury, blood vessel injury, blood clots, DVT, and the need for other treatments and/or procedures. Recommend sentinel lymph node mapping. We'll obtain lymphoscintigraphy to see  which lymph bed this area is draining. Risk of bleeding, infection, lymphedema, blood vessel injury, nerve injury, loss of function of extremity, and the need for other procedures. Pathophysiology of melanoma discussed with the patient and her husband today. Questions answered and information given.  Current Plans Pt Education - Initial surgical management of melanoma of the skin and unusual sites: discussed with patient and provided information. The pathophysiology of skin & subcutaneous masses was discussed. Natural history risks without surgery were discussed. I recommended surgery to remove the mass. I explained the technique of removal with use of local anesthesia & possible need for more aggressive sedation/anesthesia for patient comfort.  Risks such as bleeding, infection, wound breakdown, heart attack, death, and other risks were discussed. I noted a good likelihood this will help address the problem. Possibility that this will not correct all symptoms was explained. Possibility of regrowth/recurrence of the mass was discussed. We will work to minimize complications. Questions were answered. The patient expresses understanding & wishes to proceed with surgery.  Pt Education - CCS Free Text Education/Instructions: discussed with patient and provided information. DYSPLASTIC NEVUS (D23.9)

## 2015-09-20 ENCOUNTER — Ambulatory Visit (HOSPITAL_COMMUNITY)
Admission: RE | Admit: 2015-09-20 | Discharge: 2015-09-20 | Disposition: A | Discharge status: Home / Self Care | Payer: 59 | Source: Ambulatory Visit | Attending: Surgery | Admitting: Surgery

## 2015-09-20 DIAGNOSIS — C4359 Malignant melanoma of other part of trunk: Secondary | ICD-10-CM | POA: Insufficient documentation

## 2015-09-20 MED ORDER — TECHNETIUM TC 99M SULFUR COLLOID FILTERED
0.5000 | Freq: Once | INTRAVENOUS | Status: AC | PRN
  Administered 2015-09-20: 0.5 via INTRADERMAL

## 2015-09-27 NOTE — Pre-Procedure Instructions (Signed)
Lindsey Palmer  09/27/2015      [id number] - [place], [place] - [address] [place] [address] Phone: [phone] Fax: [phone]    Your procedure is scheduled on Tues, Nov 22 @ 8:30 AM  Report to [facility] Admitting at 6:30 AM  Call this number if you have problems the morning of surgery:  [phone]   Remember:  Do not eat food or drink liquids after midnight.               No Goody's,BC's,Aleve,Aspirin,Ibuprofen,Motrin,Advil,Fish Oil,or any Herbal Medications.    Do not wear jewelry, make-up or nail polish.  Do not wear lotions, powders, or perfumes.  You may wear deodorant.  Do not shave 48 hours prior to surgery.    Do not bring valuables to the hospital.  [facility] is not responsible for any belongings or valuables.  Contacts, dentures or bridgework may not be worn into surgery.  Leave your suitcase in the car.  After surgery it may be brought to your room.  For patients admitted to the hospital, discharge time will be determined by your treatment team.  Patients discharged the day of surgery will not be allowed to drive home.    Special instructions:   - Preparing for Surgery  Before surgery, you can play an important role.  Because skin is not sterile, your skin needs to be as free of germs as possible.  You can reduce the number of germs on you skin by washing with CHG (chlorahexidine gluconate) soap before surgery.  CHG is an antiseptic cleaner which kills germs and bonds with the skin to continue killing germs even after washing.  Please DO NOT use if you have an allergy to CHG or antibacterial soaps.  If your skin becomes reddened/irritated stop using the CHG and inform your nurse when you arrive at Short Stay.  Do not shave (including legs and underarms) for at least 48 hours prior to the first CHG shower.  You may shave your face.  Please follow these instructions  carefully:   1.  Shower with CHG Soap the night before surgery and the                                morning of Surgery.  2.  If you choose to wash your hair, wash your hair first as usual with your       normal shampoo.  3.  After you shampoo, rinse your hair and body thoroughly to remove the                      Shampoo.  4.  Use CHG as you would any other liquid soap.  You can apply chg directly       to the skin and wash gently with scrungie or a clean washcloth.  5.  Apply the CHG Soap to your body ONLY FROM THE NECK DOWN.        Do not use on open wounds or open sores.  Avoid contact with your eyes,       ears, mouth and genitals (private parts).  Wash genitals (private parts)       with your normal soap.  6.  Wash thoroughly, paying special attention to the area where your surgery        will  be performed.  7.  Thoroughly rinse your body with warm water from the neck down.  8.  DO NOT shower/wash with your normal soap after using and rinsing off       the CHG Soap.  9.  Pat yourself dry with a clean towel.            10.  Wear clean pajamas.            11.  Place clean sheets on your bed the night of your first shower and do not        sleep with pets.  Day of Surgery  Do not apply any lotions/deoderants the morning of surgery.  Please wear clean clothes to the hospital/surgery center.    Please read over the following fact sheets that you were given. Pain Booklet, Coughing and Deep Breathing and Surgical Site Infection Prevention

## 2015-09-28 ENCOUNTER — Inpatient Hospital Stay (HOSPITAL_COMMUNITY)
Admission: RE | Admit: 2015-09-28 | Discharge: 2015-09-28 | Disposition: A | Discharge status: Home / Self Care | Payer: Self-pay | Source: Ambulatory Visit

## 2015-09-28 NOTE — Progress Notes (Signed)
Nurse called patient to inquire about scheduled 0900 PAT appointment. Patient stated she already called Dr. Josetta Huddle office on Monday and informed them that she was not having surgery as she was going to see a melanoma specialist instead. Will Airline pilot of this.

## 2015-09-30 HISTORY — PX: EXCISION MELANOMA WITH SENTINEL LYMPH NODE BIOPSY: SHX5628

## 2015-10-04 ENCOUNTER — Encounter (HOSPITAL_COMMUNITY): Payer: 59

## 2015-10-04 ENCOUNTER — Ambulatory Visit (HOSPITAL_COMMUNITY): Admission: RE | Admit: 2015-10-04 | Payer: 59 | Source: Ambulatory Visit | Admitting: Surgery

## 2015-10-04 ENCOUNTER — Encounter (HOSPITAL_COMMUNITY): Admission: RE | Payer: Self-pay | Source: Ambulatory Visit

## 2015-10-04 SURGERY — EXCISION, MELANOMA, WITH SENTINEL LYMPH NODE BIOPSY
Anesthesia: General | Laterality: Right

## 2015-10-12 DIAGNOSIS — C4359 Malignant melanoma of other part of trunk: Secondary | ICD-10-CM | POA: Insufficient documentation

## 2015-10-25 HISTORY — PX: AXILLARY LYMPH NODE DISSECTION: SHX5229

## 2015-11-16 DIAGNOSIS — C4359 Malignant melanoma of other part of trunk: Secondary | ICD-10-CM | POA: Diagnosis not present

## 2016-01-27 DIAGNOSIS — D485 Neoplasm of uncertain behavior of skin: Secondary | ICD-10-CM | POA: Diagnosis not present

## 2016-01-27 DIAGNOSIS — D239 Other benign neoplasm of skin, unspecified: Secondary | ICD-10-CM | POA: Diagnosis not present

## 2016-01-27 DIAGNOSIS — Z85820 Personal history of malignant melanoma of skin: Secondary | ICD-10-CM | POA: Diagnosis not present

## 2016-02-23 DIAGNOSIS — D485 Neoplasm of uncertain behavior of skin: Secondary | ICD-10-CM | POA: Diagnosis not present

## 2016-03-26 MED FILL — RIZATRIPTAN 10 MG ODT: 10 | 75 days supply | Qty: 21 | Fill #1

## 2016-03-27 ENCOUNTER — Other Ambulatory Visit: Payer: Self-pay

## 2016-03-27 NOTE — Patient Outreach (Signed)
[facility] Screening: Patient returned call and left a voice mail.  I called patient back and no answer. I left a message for patient to return call.  PLAN: will continue to attempt to reach patient.  Tomasa Rand, RN, BSN, CEN [facility] [employer] [phone]

## 2016-03-27 NOTE — Patient Outreach (Signed)
[facility] screening:  Reviewed medical record.  Placed call to patient for screening. No answer. Left a message requesting a call back.  PLAN: Will await a call back and will continue to outreach patient to assess needs.  Tomasa Rand, RN, BSN, CEN [facility] [employer] [phone]

## 2016-03-27 NOTE — Patient Outreach (Signed)
[facility] screening: Patient returned call and states that she is doing well. Reports having procedures at [facility] cancer center.   Patient denies any new concerns or problems at this time. Provided information about [facility] as a benefit.   Provided [facility] office number if patient needs any assistance in the future.  PLAN: Will close case as no needs identified. Will inform [facility].  Tomasa Rand, RN, BSN, CEN [facility] [employer] [phone]

## 2016-04-17 DIAGNOSIS — M79672 Pain in left foot: Secondary | ICD-10-CM | POA: Diagnosis not present

## 2016-04-17 DIAGNOSIS — M21622 Bunionette of left foot: Secondary | ICD-10-CM | POA: Diagnosis not present

## 2016-05-08 DIAGNOSIS — D485 Neoplasm of uncertain behavior of skin: Secondary | ICD-10-CM | POA: Diagnosis not present

## 2016-05-16 DIAGNOSIS — G43909 Migraine, unspecified, not intractable, without status migrainosus: Secondary | ICD-10-CM | POA: Diagnosis not present

## 2016-05-16 DIAGNOSIS — E042 Nontoxic multinodular goiter: Secondary | ICD-10-CM | POA: Diagnosis not present

## 2016-05-16 DIAGNOSIS — C4359 Malignant melanoma of other part of trunk: Secondary | ICD-10-CM | POA: Diagnosis not present

## 2016-05-16 DIAGNOSIS — C439 Malignant melanoma of skin, unspecified: Secondary | ICD-10-CM | POA: Diagnosis not present

## 2016-05-16 DIAGNOSIS — E041 Nontoxic single thyroid nodule: Secondary | ICD-10-CM | POA: Diagnosis not present

## 2016-06-09 ENCOUNTER — Emergency Department (HOSPITAL_BASED_OUTPATIENT_CLINIC_OR_DEPARTMENT_OTHER)
Admission: EM | Admit: 2016-06-09 | Discharge: 2016-06-09 | Disposition: A | Discharge status: Home / Self Care | Payer: 59 | Attending: Emergency Medicine | Admitting: Emergency Medicine

## 2016-06-09 ENCOUNTER — Encounter (HOSPITAL_BASED_OUTPATIENT_CLINIC_OR_DEPARTMENT_OTHER): Payer: Self-pay | Admitting: Emergency Medicine

## 2016-06-09 DIAGNOSIS — S61214A Laceration without foreign body of right ring finger without damage to nail, initial encounter: Secondary | ICD-10-CM | POA: Diagnosis not present

## 2016-06-09 DIAGNOSIS — S61219A Laceration without foreign body of unspecified finger without damage to nail, initial encounter: Secondary | ICD-10-CM

## 2016-06-09 DIAGNOSIS — Y999 Unspecified external cause status: Secondary | ICD-10-CM | POA: Insufficient documentation

## 2016-06-09 DIAGNOSIS — S61211A Laceration without foreign body of left index finger without damage to nail, initial encounter: Secondary | ICD-10-CM | POA: Diagnosis not present

## 2016-06-09 DIAGNOSIS — W268XXA Contact with other sharp object(s), not elsewhere classified, initial encounter: Secondary | ICD-10-CM | POA: Diagnosis not present

## 2016-06-09 DIAGNOSIS — Y929 Unspecified place or not applicable: Secondary | ICD-10-CM | POA: Diagnosis not present

## 2016-06-09 DIAGNOSIS — Y9389 Activity, other specified: Secondary | ICD-10-CM | POA: Insufficient documentation

## 2016-06-09 HISTORY — DX: Malignant melanoma of skin, unspecified: C43.9

## 2016-06-09 MED ORDER — LIDOCAINE HCL 2 % IJ SOLN
20.0000 mL | Freq: Once | INTRAMUSCULAR | Status: AC
  Administered 2016-06-09: 400 mg
  Filled 2016-06-09: qty 20

## 2016-06-09 MED ORDER — TETANUS-DIPHTH-ACELL PERTUSSIS 5-2.5-18.5 LF-MCG/0.5 IM SUSP
0.5000 mL | Freq: Once | INTRAMUSCULAR | Status: AC
  Administered 2016-06-09: 0.5 mL via INTRAMUSCULAR
  Filled 2016-06-09: qty 0.5

## 2016-06-09 NOTE — ED Triage Notes (Signed)
Patient states she cutting her 4th digit, right hand on a mandolin (vegetable slicer). Bandage in place at this time. Bleeding controlled at this time.

## 2016-06-09 NOTE — ED Provider Notes (Signed)
[place] DEPT [facility] Provider Note   CSN: [id number] Arrival date & time: 06/09/16  1306  First Provider Contact:  None       History   Chief Complaint Chief Complaint  Patient presents with  . Extremity Laceration    4th digit, rigth hand    HPI Lindsey Palmer is a 35 y.o. female.  HPI   35 year old female presents today with laceration. Patient reports that she was using a vegetable slicer approximately one hour prior to arrival when she cut her right fourth fingertip. Patient reports bleeding was controlled with direct pressure. No medications prior to arrival. Uncertain TDAP.   Past Medical History:  Diagnosis Date  . Abnormal Pap smear   . H/O varicella   . Headache(784.0)   . Melanoma ([place])    stage3  . Urinary complications     Patient Active Problem List   Diagnosis Date Noted  . Postpartum care following vaginal delivery - intact (9/9) 07/22/2012    Past Surgical History:  Procedure Laterality Date  . ANKLE SURGERY    . DILATION AND CURETTAGE OF UTERUS    . lymphnode disection Left     OB History    Gravida Para Term Preterm AB Living   4 2 2   2 2    SAB TAB Ectopic Multiple Live Births   2              Home Medications    Prior to Admission medications   Medication Sig Start Date End Date Taking? Authorizing Provider  bisacodyl (DULCOLAX) 5 MG EC tablet Take 15 mg by mouth at bedtime as needed. For bm    Historical Provider, MD  Prenatal Vit-Fe Fumarate-FA (PRENATAL MULTIVITAMIN) TABS Take 1 tablet by mouth at bedtime.    Historical Provider, MD    Family History Family History  Problem Relation Age of Onset  . Migraines Mother   . Migraines Brother   . Cancer Paternal Grandfather     melanoma    Social History Social History  Substance Use Topics  . Smoking status: Never Smoker  . Smokeless tobacco: Never Used  . Alcohol use Yes     Comment: occ     Allergies   Review of patient's allergies indicates no known  allergies.   Review of Systems Review of Systems  All other systems reviewed and are negative.    Physical Exam Updated Vital Signs BP 117/74 (BP Location: Right Arm)   Pulse 64   Temp 98.3 F (36.8 C) (Oral)   Resp 16   Ht 6' (1.829 m)   Wt 68 kg   LMP 05/16/2016 (Approximate)   SpO2 100%   BMI 20.34 kg/m   Physical Exam  Constitutional: She is oriented to person, place, and time. She appears well-developed and well-nourished.  HENT:  Head: Normocephalic and atraumatic.  Eyes: Conjunctivae are normal. Pupils are equal, round, and reactive to light. Right eye exhibits no discharge. Left eye exhibits no discharge. No scleral icterus.  Neck: Normal range of motion. No JVD present. No tracheal deviation present.  Pulmonary/Chest: Effort normal. No stridor.  Musculoskeletal:  1 cm laceration to left distal 4th finger along the lateral aspect. Distal nailbed involved. No deep space involvement   Neurological: She is alert and oriented to person, place, and time. Coordination normal.  Psychiatric: She has a normal mood and affect. Her behavior is normal. Judgment and thought content normal.  Nursing note and vitals reviewed.    ED Treatments /  Results  Labs (all labs ordered are listed, but only abnormal results are displayed) Labs Reviewed - No data to display  EKG  EKG Interpretation None       Radiology No results found.  Procedures Procedures (including critical care time)  LACERATION REPAIR Performed by: Elmer Ramp Authorized by: Elmer Ramp Consent: Verbal consent obtained. Risks and benefits: risks, benefits and alternatives were discussed Consent given by: patient Patient identity confirmed: provided demographic data Prepped and Draped in normal sterile fashion Wound explored  Laceration Location: left 4th finger  Laceration Length: 1 cm  No Foreign Bodies seen or palpated  Anesthesia: digital block  Local anesthetic:  lidocaine 2 % 0 epinephrine  Anesthetic total: 4 ml  Irrigation method: syringe Amount of cleaning: standard  Skin closure: simple  Number of sutures: 5  Technique: simple inter  Patient tolerance: Patient tolerated the procedure well with no immediate complications.  Medications Ordered in ED Medications  lidocaine (XYLOCAINE) 2 % (with pres) injection 400 mg (400 mg Infiltration Given 06/09/16 1503)  Tdap (BOOSTRIX) injection 0.5 mL (0.5 mLs Intramuscular Given 06/09/16 1500)     Initial Impression / Assessment and Plan / ED Course  I have reviewed the triage vital signs and the nursing notes.  Pertinent labs & imaging results that were available during my care of the patient were reviewed by me and considered in my medical decision making (see chart for details).  Clinical Course     Final Clinical Impressions(s) / ED Diagnoses   Final diagnoses:  Finger laceration, initial encounter   Labs:   Imaging:  Consults:  Therapeutics:  Discharge Meds:   Assessment/Plan:  16 showed female presents today with laceration. Repaired here in the ED without complication. Patient will need primary care follow-up for reevaluation, ED return precautions given. She verbalized understanding and agreement today's plan had no further questions or concerns at time of discharge   New Prescriptions Discharge Medication List as of 06/09/2016  2:55 PM       Okey Regal, PA-C 06/09/16 [place], MD 06/10/16 [phone]

## 2016-06-09 NOTE — Discharge Instructions (Signed)
Please monitor for signs of infection, if any present please return immediately to the emergency room. Please follow-up with her primary care for reevaluation. Sutures will dissolve on their own. Please keep wound clean and covered.

## 2016-06-09 NOTE — ED Notes (Signed)
Merry Proud, PA-C in room with patient now.

## 2016-06-12 ENCOUNTER — Other Ambulatory Visit: Payer: Self-pay | Admitting: *Deleted

## 2016-06-12 DIAGNOSIS — S61314D Laceration without foreign body of right ring finger with damage to nail, subsequent encounter: Secondary | ICD-10-CM | POA: Diagnosis not present

## 2016-06-12 NOTE — Patient Outreach (Signed)
[place] [facility]) Care Management  06/12/2016  Anyeli Fietz 06-07-1981 [id number]   Subjective: Telephone call to patient's home number, no answer, left HIPAA compliant voicemail message, and requested call back.    Objective:  Per chart review: Patient has ED visit on 06/09/16 for finger laceration with repair.   Assessment: Received [facility]MR Nurse Advice line follow up referral on 06/11/16.   Nurse line follow up, pending patient contact.   Plan:   RNCM will call patient within 1 business week for 2nd attempt telephone outreach, nurse line follow up, if no return call.   Rachel Samples H. Annia Friendly, BSN, [place] [facility] Telephonic CM Phone: [phone] Fax: [phone]

## 2016-06-13 ENCOUNTER — Other Ambulatory Visit: Payer: Self-pay | Admitting: *Deleted

## 2016-06-13 NOTE — Patient Outreach (Addendum)
[place] [facility]) Care Management  06/13/2016  Lindsey Palmer [birth year] [id number]   Subjective: Telephone call to patient's home number, no answer, left HIPAA compliant voicemail message, and requested call back.    Objective:  Per chart review: Patient has ED visit on 06/09/16 for finger laceration with repair.   Assessment: Received UMR Nurse Advice line follow up referral on 06/11/16.   Nurse line follow up, pending patient contact.   Plan:   RNCM will call patient within 1 business week for 3rd attempt telephone outreach, nurse line follow up, if no return call.   Kristof Nadeem H. Annia Friendly, BSN, [place] [facility] Telephonic CM Phone: [phone] Fax: [phone]

## 2016-06-14 ENCOUNTER — Ambulatory Visit: Payer: Self-pay | Admitting: *Deleted

## 2016-06-15 ENCOUNTER — Encounter: Payer: Self-pay | Admitting: *Deleted

## 2016-06-15 ENCOUNTER — Other Ambulatory Visit: Payer: Self-pay | Admitting: *Deleted

## 2016-06-15 NOTE — Patient Outreach (Addendum)
[place] [facility]) Care Management  06/15/2016  Ahlexis Debruhl 04-23-1981 [id number]   Subjective:Telephone call to patient's home/ mobile number, no answer, left HIPAA compliant voicemail message, and requested call back.  Telephone call from patient and HIPAA verified.    States she is doing better and waiting for sutures to fall out of finger wound.  Discussed [facility] Care Management  24 hour Nurse Advice line follow up, care management services and patient voices understanding. States she has already returned to work on modified duty and has had a follow up with primary MD.  States her husband works for [employer], they are utilizing employee benefits and resources.  Patient states she does not have any care coordination, disease management, community resource, pharmacy, or transportation needs at this time.   Patient in agreement to receive information on [facility] Care Management.    Objective: Perchart review: Patient has ED visit on 06/09/16 for finger laceration with repair.   Assessment: Received UMR Nurse Advice line follow up referral on 06/11/16. Nurse line follow up completed, no Telephonic RNCM or care management needs at this time.   Plan: RNCM will send patient successful outreach letter, [facility] pamphlet,  and magnet.   RNCM will send case closure due to follow up completed / no care management needs request to [facility].     Deairra Halleck H. Annia Friendly, BSN, [place] Management [facility] Telephonic CM Phone: [phone] Fax: [phone]

## 2016-08-14 MED FILL — RIZATRIPTAN 10 MG ODT: 10 | 90 days supply | Qty: 27 | Fill #0 | Status: TO

## 2016-11-19 DIAGNOSIS — C439 Malignant melanoma of skin, unspecified: Secondary | ICD-10-CM | POA: Diagnosis not present

## 2016-11-19 DIAGNOSIS — C4359 Malignant melanoma of other part of trunk: Secondary | ICD-10-CM | POA: Diagnosis not present

## 2017-01-22 DIAGNOSIS — J0120 Acute ethmoidal sinusitis, unspecified: Secondary | ICD-10-CM | POA: Diagnosis not present

## 2017-01-22 MED FILL — RIZATRIPTAN 10 MG ODT: 10 | 30 days supply | Qty: 9 | Fill #0

## 2017-01-22 MED FILL — AMOX TR-K CLV 875-125 MG TA: 875-125 | 10 days supply | Qty: 20 | Fill #0

## 2017-05-16 MED FILL — RIZATRIPTAN 10 MG ODT: 10 | 30 days supply | Qty: 12 | Fill #0

## 2017-06-05 DIAGNOSIS — C4359 Malignant melanoma of other part of trunk: Secondary | ICD-10-CM | POA: Diagnosis not present

## 2017-08-14 MED FILL — RIZATRIPTAN 10 MG ODT: 10 | 30 days supply | Qty: 12 | Fill #0

## 2017-09-04 DIAGNOSIS — Z20818 Contact with and (suspected) exposure to other bacterial communicable diseases: Secondary | ICD-10-CM | POA: Diagnosis not present

## 2017-09-04 DIAGNOSIS — J0101 Acute recurrent maxillary sinusitis: Secondary | ICD-10-CM | POA: Diagnosis not present

## 2017-09-04 MED FILL — AMOX TR-K CLV 875-125 MG TA: 875-125 | 10 days supply | Qty: 20 | Fill #0

## 2017-09-12 ENCOUNTER — Encounter: Payer: Self-pay | Admitting: Sports Medicine

## 2017-09-12 ENCOUNTER — Ambulatory Visit (INDEPENDENT_AMBULATORY_CARE_PROVIDER_SITE_OTHER): Payer: 59

## 2017-09-12 ENCOUNTER — Ambulatory Visit (INDEPENDENT_AMBULATORY_CARE_PROVIDER_SITE_OTHER): Payer: 59 | Admitting: Sports Medicine

## 2017-09-12 VITALS — BP 116/80 | HR 71 | Ht 72.0 in | Wt 158.8 lb

## 2017-09-12 DIAGNOSIS — M19079 Primary osteoarthritis, unspecified ankle and foot: Secondary | ICD-10-CM | POA: Diagnosis not present

## 2017-09-12 DIAGNOSIS — M79672 Pain in left foot: Secondary | ICD-10-CM

## 2017-09-12 DIAGNOSIS — R269 Unspecified abnormalities of gait and mobility: Secondary | ICD-10-CM

## 2017-09-12 DIAGNOSIS — S90851A Superficial foreign body, right foot, initial encounter: Secondary | ICD-10-CM | POA: Diagnosis not present

## 2017-09-12 DIAGNOSIS — S90852A Superficial foreign body, left foot, initial encounter: Secondary | ICD-10-CM | POA: Diagnosis not present

## 2017-09-12 MED ORDER — IBUPROFEN-FAMOTIDINE 800-26.6 MG PO TABS: 1.0000 | ORAL_TABLET | Freq: Three times a day (TID) | ORAL | 0 refills | Status: DC | PRN

## 2017-09-12 NOTE — Progress Notes (Signed)
OFFICE VISIT NOTE Lindsey Palmer. Lindsey Palmer, [place] [facility] [place]  Lindsey Palmer - 36 y.o. female MRN [id number]  Date of birth: 1981-02-10  Visit Date: 09/12/2017  PCP: Lindsey Carol, MD   Referred by: Lindsey Carol, MD  Lindsey Palmer, [place] acting as scribe for Dr. Paulla Palmer.  SUBJECTIVE:   Chief Complaint  Patient presents with  . New Patient (Initial Visit)    LT ankle pain, back pain   HPI: As below and per problem based documentation when appropriate.  Lindsey Palmer is an established patient presenting today for evaluation of LT ankle pain and back pain.   LT ankle pain has been present x several years. She had surgery on the ankle 9 years ago at [place]. Pain is mostly on the lateral aspect of the ankle and foot.  The pain is described as sharp and stabbing. Worsened with walking barefoot. She has pain any time she bears weight Improves with shoe inserts. Therapies tried include : She has tried PT with no relief.  Other associated symptoms include: She feels that she is walking abnormally d/t the ankle and foot pain which is causing pain in her hips and back. She has pain at the 5th phalanx and metatarsal, there is a bone abnormality that causes a sharp stabbing pain and irritates the skin. Her surgeon told her that they could shave the bone down to see if that will give her any relief but she is undecided about this. They have also discussed possibly doing another surgery but she just started a new job and cannot take that much time off from work. She has multiple callus on her toes from rubbing them again her shoe when she walks.   Last xray of the ankle was about 3 years ago.        Review of Systems  Constitutional: Positive for fever (recent sinus infection) and malaise/fatigue. Negative for chills.  Respiratory: Negative for shortness of breath and wheezing.   Cardiovascular: Negative for chest pain,  palpitations and leg swelling.  Musculoskeletal: Positive for joint pain.  Neurological: Positive for weakness and headaches. Negative for dizziness and tingling.    Otherwise per HPI.   HISTORY & PERTINENT PRIOR DATA:  Prior History reviewed and updated per electronic medical record. Significant history, findings, studies and interim changes include: No additional findings.  reports that  has never smoked. she has never used smokeless tobacco. No results for input(s): HGBA1C, LABURIC, CREATINE in the last 8760 hours. Problem  Arthritis, Midfoot   Status post subtalar fusion in 2010     OBJECTIVE:  VS:  HT:6' (182.9 cm)   WT:158 lb 12.8 oz (72 kg)  BMI:21.53    BP:116/80  HR:71bpm  TEMP: ( )  RESP:98 %   PHYSICAL EXAM: Constitutional: WDWN, Non-toxic appearing. Psychiatric: Alert & appropriately interactive.Not depressed or anxious appearing. Respiratory: No increased work of breathing. Trachea Midline Eyes: Pupils are equal. EOM intact without nystagmus. No scleral icterus  LOWER EXTREMITIES: No clubbing or cyanosis appreciated No significant venous stasis changes No calf tenderness, negative Homan's sign, no calf cords Generalized/Pre-tibial edema: none Pedal Pulses: Normal symmetrically palpable Sensation in LE dermatomes: intact to light touch  Foot and ankle: Overall she has very little hindfoot motion and has marked tenderness to palpation over the navicular and cuboid but this is nonfocal.  She has a stable ankle drawer test.  Inversion and eversion strength is intact.  Dorsi flexion plantarflexion strength  is normal.  Her range of motion is to 95 degrees of dorsiflexion. Splay toe between first and second toes.   Dg Ankle Complete Left  Result Date: 09/12/2017 CLINICAL DATA:  Pt c/o worsening chronic pain in Lt foot and Lt ankle x 9 yrs. Pt states pain has become more severe in the last few months. Pain is most severe @ Lt little toe and 5th metatarsal. No  recent injury. Pt states she had Lt foot surgery surgery 9 yrs ago at [facility] in [place] due to "collapsed foot". EXAM: LEFT ANKLE COMPLETE - 3+ VIEW COMPARISON:  None. FINDINGS: No fracture.  No bone lesion. The ankle joint is normally spaced and aligned. No arthropathic changes. A single screw extends across the mid talus, below the posterior facet of the subtalar joint, well-seated. Soft tissues are unremarkable. IMPRESSION: 1. No fracture, bone lesion or ankle joint abnormality. Electronically Signed   By: Lajean Manes M.D.   On: 09/12/2017 10:34   Dg Foot Complete Left  Result Date: 09/12/2017 CLINICAL DATA:  Pt c/o worsening chronic pain in Lt foot and Lt ankle x 9 yrs. Pt states pain has become more severe in the last few months. Pain is most severe @ Lt little toe and 5th metatarsal. No recent injury. Pt states she had Lt foot surgery surgery 9 yrs ago at [facility] in [place] due to "collapsed foot". EXAM: LEFT FOOT - COMPLETE 3+ VIEW COMPARISON:  None. FINDINGS: No fracture.  No bone lesion. Joints are normally spaced and aligned. A screw extends from the inferior, lateral aspect of the calcaneus superiorly and medially. Tip projects inferior to the posterior facet of the subtalar joint. There is a single surgical tack which projects between the anterior talus and navicular. There is a small linear metal foreign body within the superficial soft tissues is of the plantar forefoot. Soft tissues are otherwise unremarkable. IMPRESSION: 1. No fracture, bone lesion or significant joint abnormality. 2. Postsurgical changes of the hindfoot. 3. Single small linear metal foreign body in the superficial forefoot plantar soft tissues, likely incidental. Electronically Signed   By: Lajean Manes M.D.   On: 09/12/2017 10:38    ASSESSMENT & PLAN:   1. Left foot pain   2. Arthritis, midfoot   3. Abnormality of gait    Plan:     Arthritis, midfoot She has moderate subtalar arthritis with  prior fusion She would benefit from custom cushion orthotics will have her follow-up for this.  We will also have her start on anti-inflammatories and see if this can provide her any adequate relief and discussed appropriate bursting of medicine.   ++++++++++++++++++++++++++++++++++++++++++++ Orders:  Orders Placed This Encounter  Procedures  . DG Ankle Complete Left  . DG Foot Complete Left    Meds:  Meds ordered this encounter  Medications  . Ibuprofen-Famotidine (DUEXIS) 800-26.6 MG TABS    Sig: Take 1 tablet by mouth 3 (three) times daily as needed.    Dispense:  9 tablet    Refill:  0    ++++++++++++++++++++++++++++++++++++++++++++ Follow-up: Return in about 1 week (around 09/19/2017), or for orthotics.   Pertinent documentation may be included in additional procedure notes, imaging studies, problem based documentation and patient instructions. Please see these sections of the encounter for additional information regarding this visit. CMA/ATC served as Education administrator during this visit. History, Physical, and Plan performed by medical provider. Documentation and orders reviewed and attested to.      Gerda Diss, DO  [facility] Physician

## 2017-09-20 ENCOUNTER — Ambulatory Visit: Payer: 59 | Admitting: Sports Medicine

## 2017-09-26 ENCOUNTER — Encounter: Payer: Self-pay | Admitting: Oncology

## 2017-09-26 ENCOUNTER — Telehealth: Payer: Self-pay | Admitting: Oncology

## 2017-09-26 NOTE — Telephone Encounter (Signed)
Appt has been scheduled for the pt to see Dr. Alen Blew on 12/4 at 2pm. Pt is transferring her care from [facility] because of the distance. Pt agreed to the appt date and time. Letter mailed to the pt.

## 2017-09-27 ENCOUNTER — Ambulatory Visit (INDEPENDENT_AMBULATORY_CARE_PROVIDER_SITE_OTHER): Payer: 59 | Admitting: Sports Medicine

## 2017-09-27 DIAGNOSIS — M79672 Pain in left foot: Secondary | ICD-10-CM

## 2017-09-27 DIAGNOSIS — R269 Unspecified abnormalities of gait and mobility: Secondary | ICD-10-CM

## 2017-09-27 NOTE — Progress Notes (Signed)
  OFFICE VISIT NOTE Lindsey Palmer, Lindsey Palmer at [facility] [id number]  Lindsey Palmer - 36 y.o. female MRN [id number]  Date of birth: 11/14/1980  Visit Date: 09/27/2017  PCP: Seward Carol, MD   Referred by: Seward Carol, MD  Thalia Bloodgood PT, LAT, ATC acting as scribe for Dr. Paulla Fore.  SUBJECTIVE:   Chief Complaint  Patient presents with  . Follow-up    L foot pain and orthotics   HPI: As below and per problem based documentation when appropriate.  [name] is an established pt presenting today to get orthotics made.  Pt states that her L foot feels the same but notes that her back has started to bother her again.  Pt states that the Duexis did help her but notes that her foot is bothering her still.    ROS  Otherwise per HPI.   HISTORY & PERTINENT PRIOR DATA:  Prior History reviewed and updated per electronic medical record. Significant history, findings, studies and interim changes include: No additional findings.  reports that  has never smoked. she has never used smokeless tobacco. No results for input(s): HGBA1C, LABURIC, CREATINE in the last 8760 hours. No problems updated.  OBJECTIVE:  VS:  HT:    WT:   BMI:     BP:   HR: bpm  TEMP: ( )  RESP:   PHYSICAL EXAM: Constitutional: WDWN, Non-toxic appearing. Psychiatric: Alert & appropriately interactive. Not depressed or anxious appearing. Respiratory: No increased work of breathing. Trachea Midline Eyes: Pupils are equal. EOM intact without nystagmus. No scleral icterus  Bilateral feet overall moderately high arch.  She has moderate TTP over the longitudinal arch but this is mild.  Splay toe.  Dynamic pronation that improves with orthotics.  ASSESSMENT & PLAN:   1. Left foot pain   2. Gait disturbance    Plan: Orthotics fabricated today.  Overall she reports good improvement in her foot discomfort.  Gait is improved.  PROCEDURE: CUSTOM ORTHOTIC  FABRICATION Patient's underlying musculoskeletal conditions are directly related to poor biomechanics and will benefit from a functional custom orthotic.  There are no significant foot deformities that complicate the use of a custom orthotic.  The patient was fitted for a standard, cushioned, semi-rigid orthotic. The orthotic was heated & placed on the orthotic stand. The patient was positioned in subtalar neutral position and 10 of ankle dorsiflexion and weight bearing stance on the heated orthotic blank. After completion of the molding a base was applied to the orthotic blank. The orthotic was ground to a stable position for weightbearing. The patient ambulated in these and reported they were comfortable without pressure spots.              BLANK:  Size 8 - Standard Cushioned                 BASE:  Blue EVA      POSTINGS:  n/a   ++++++++++++++++++++++++++++++++++++++++++++ Follow-up: As previously discussed  Pertinent documentation may be included in additional procedure notes, imaging studies, problem based documentation and patient instructions. Please see these sections of the encounter for additional information regarding this visit. CMA/ATC served as Education administrator during this visit. History, Physical, and Plan performed by medical provider. Documentation and orders reviewed and attested to.      Gerda Diss, [place] Sports Medicine Physician

## 2017-10-10 ENCOUNTER — Encounter: Payer: Self-pay | Admitting: Sports Medicine

## 2017-10-10 DIAGNOSIS — M79672 Pain in left foot: Secondary | ICD-10-CM | POA: Insufficient documentation

## 2017-10-10 DIAGNOSIS — M19079 Primary osteoarthritis, unspecified ankle and foot: Secondary | ICD-10-CM | POA: Insufficient documentation

## 2017-10-10 NOTE — Assessment & Plan Note (Signed)
She has moderate subtalar arthritis with prior fusion She would benefit from custom cushion orthotics will have her follow-up for this.  We will also have her start on anti-inflammatories and see if this can provide her any adequate relief and discussed appropriate bursting of medicine.

## 2017-10-11 ENCOUNTER — Encounter: Payer: Self-pay | Admitting: Sports Medicine

## 2017-10-14 ENCOUNTER — Telehealth: Payer: Self-pay | Admitting: *Deleted

## 2017-10-14 NOTE — Telephone Encounter (Signed)
Called patient to remind her of New Patient appointment with Dr. Alen Blew tomorrow. Please be here at 1:30pm. Instructed her to call [phone] if she had any questions.

## 2017-10-15 ENCOUNTER — Ambulatory Visit: Payer: 59 | Admitting: Oncology

## 2017-10-15 ENCOUNTER — Telehealth: Payer: Self-pay | Admitting: Oncology

## 2017-10-15 ENCOUNTER — Encounter: Payer: Self-pay | Admitting: Sports Medicine

## 2017-10-15 VITALS — BP 115/82 | HR 68 | Temp 98.2°F | Resp 18 | Ht 72.0 in | Wt 161.3 lb

## 2017-10-15 DIAGNOSIS — C792 Secondary malignant neoplasm of skin: Secondary | ICD-10-CM | POA: Diagnosis not present

## 2017-10-15 DIAGNOSIS — Z808 Family history of malignant neoplasm of other organs or systems: Secondary | ICD-10-CM | POA: Diagnosis not present

## 2017-10-15 DIAGNOSIS — C4359 Malignant melanoma of other part of trunk: Secondary | ICD-10-CM

## 2017-10-15 NOTE — Progress Notes (Signed)
Reason for Referral: Melanoma.  HPI: 36 year old woman currently of [place] where she lived the majority of her life.  She is a rather healthy woman without any comorbid conditions that was diagnosed in 2016.  At that time she was noted to have a lesion on the left upper back and was noted for the last few years.  She underwent a biopsy at that time and found to have a T1b malignant melanoma.  She underwent a wide excision and a left axillary lymph node dissection on October 25, 2015 after a positive left axillary sentinel lymph node biopsy.  Her final pathology revealed a stage IIIa with a T2 N1 pathology.  She has been on active surveillance under the care of Dr.Ollila for observation and surveillance since that time.  She has not received any adjuvant therapy.  For insurance and convenience purposes she wanted to establish care locally.  Medically she reports no recent complaints.  She continues to follow with dermatology every 6 months and has not reported any decline in her energy or performance status.  She does not report any new skin rashes or lesions.  She does not report any headaches, blurry vision, syncope or seizures.  She does not report any fevers or chills or sweats.  She does not report any cough, wheezing or hemoptysis.  She does not report any nausea, vomiting or abdominal pain.  She does not report any chest pain, dyspnea on exertion or palpitation.  She does not report any frequency urgency or hesitancy.  She does not up any skeletal complaints.  Remaining review of systems unremarkable.  Past Medical History:  Diagnosis Date  . Abnormal Pap smear   . H/O varicella   . Headache(784.0)   . Melanoma ([place])    stage3  . Urinary complications   :  Past Surgical History:  Procedure Laterality Date  . ANKLE SURGERY    . DILATION AND CURETTAGE OF UTERUS    . lymphnode disection Left   :   Current Outpatient Medications:  .  amoxicillin (AMOXIL) 500 MG tablet, Take 500 mg  by mouth., Disp: , Rfl:  .  Ibuprofen-Famotidine (DUEXIS) 800-26.6 MG TABS, Take 1 tablet by mouth 3 (three) times daily as needed., Disp: 9 tablet, Rfl: 0 .  rizatriptan (MAXALT-MLT) 10 MG disintegrating tablet, , Disp: , Rfl: 0:  No Known Allergies:  Family History  Problem Relation Age of Onset  . Migraines Mother   . Migraines Brother   . Cancer Paternal Grandfather        melanoma  :  Social History   Socioeconomic History  . Marital status: Married    Spouse name: Not on file  . Number of children: Not on file  . Years of education: Not on file  . Highest education level: Not on file  Social Needs  . Financial resource strain: Not on file  . Food insecurity - worry: Not on file  . Food insecurity - inability: Not on file  . Transportation needs - medical: Not on file  . Transportation needs - non-medical: Not on file  Occupational History  . Not on file  Tobacco Use  . Smoking status: Never Smoker  . Smokeless tobacco: Never Used  Substance and Sexual Activity  . Alcohol use: Yes    Comment: occ  . Drug use: No  . Sexual activity: Yes    Birth control/protection: Condom  Other Topics Concern  . Not on file  Social History Narrative  . Not  on file  :  Pertinent items are noted in HPI.  Exam: Blood pressure 115/82, pulse 68, temperature 98.2 F (36.8 C), temperature source Oral, resp. rate 18, height 6' (1.829 m), weight 161 lb 4.8 oz (73.2 kg), SpO2 98 %.  ECOG 0 General appearance: alert and cooperative without distress. Throat: No oral thrush or ulcers. Neck: no adenopathy or masses. Back: No lesions noted. Resp: clear to auscultation bilaterally Chest wall: no tenderness Cardio: regular rate and rhythm, S1, S2 normal, no murmur, click, rub or gallop GI: soft, non-tender; bowel sounds normal; no masses,  no organomegaly Extremities: extremities normal, atraumatic, no cyanosis or edema Skin: Skin color, texture, turgor normal. No rashes or  lesions Lymph nodes: Cervical, supraclavicular, and axillary nodes normal.  CBC    Component Value Date/Time   WBC 11.5 (H) 07/22/2012 0525   RBC 3.82 (L) 07/22/2012 0525   HGB 11.3 (L) 07/22/2012 0525   HCT 33.8 (L) 07/22/2012 0525   PLT 180 07/22/2012 0525   MCV 88.5 07/22/2012 0525   MCH 29.6 07/22/2012 0525   MCHC 33.4 07/22/2012 0525   RDW 14.1 07/22/2012 0525     Assessment and Plan:   36 year old woman with the following issues:  1.  Malignant melanoma of the left upper back diagnosed in November 2016.  She underwent a local wide excision and sentinel lymph node biopsy of the left axilla that was positive.  She underwent a lymph node dissection completed in December 2016.  The final pathology was T2N1 with only one positive lymph node.  She has been on active surveillance since that time and her last PET scan obtained in January 2018 and was unremarkable.  The natural course of this disease was reviewed today with the patient as well as the surveillance approach.  The plan is to continue with active surveillance at this time with medical oncology follow-up every 6 months and repeat PET scan on an annual basis.  We will arrange for her next PET/CT scan to be done in January 2019 and MD follow-up in 6 months.  2.  Skin protection: We will continue to emphasize the importance of skin protection moving forward and she continues to have dermatology follow-up every 6 months.  3.  Follow-up: Will be in 6 months sooner if needed to.

## 2017-10-15 NOTE — Telephone Encounter (Signed)
Scheduled appt per 12/4 los - Gave patient AVS and calender per los. Central radiology to contact patient with PET appt.

## 2017-11-14 ENCOUNTER — Ambulatory Visit (HOSPITAL_COMMUNITY)
Admission: RE | Admit: 2017-11-14 | Discharge: 2017-11-14 | Disposition: A | Discharge status: Home / Self Care | Payer: 59 | Source: Ambulatory Visit | Attending: Oncology | Admitting: Oncology

## 2017-11-14 DIAGNOSIS — C4359 Malignant melanoma of other part of trunk: Secondary | ICD-10-CM | POA: Insufficient documentation

## 2017-11-14 LAB — GLUCOSE, CAPILLARY: Glucose-Capillary: 87 mg/dL (ref 65–99)

## 2017-11-14 MED ORDER — FLUDEOXYGLUCOSE F - 18 (FDG) INJECTION
8.0000 | Freq: Once | INTRAVENOUS | Status: AC | PRN
  Administered 2017-11-14: 8 via INTRAVENOUS

## 2017-11-18 ENCOUNTER — Telehealth: Payer: Self-pay | Admitting: *Deleted

## 2017-11-18 NOTE — Telephone Encounter (Signed)
Patient called and left a voice mail message stating,"I would like to get the PET scan results from last week. My return number is [phone]."

## 2017-11-19 ENCOUNTER — Telehealth: Payer: Self-pay | Admitting: *Deleted

## 2017-11-19 NOTE — Telephone Encounter (Signed)
As noted below by Dr. Alen Blew, I informed patient that her PET scan was normal. Informed her to call [facility] if she had any questions.

## 2017-11-19 NOTE — Telephone Encounter (Signed)
Please let her know her PET is normal

## 2018-01-21 DIAGNOSIS — C439 Malignant melanoma of skin, unspecified: Secondary | ICD-10-CM | POA: Diagnosis not present

## 2018-01-21 DIAGNOSIS — G43909 Migraine, unspecified, not intractable, without status migrainosus: Secondary | ICD-10-CM | POA: Diagnosis not present

## 2018-01-24 MED FILL — RIZATRIPTAN 10 MG ODT: 10 | 30 days supply | Qty: 12 | Fill #0

## 2018-04-17 ENCOUNTER — Inpatient Hospital Stay: Payer: 59

## 2018-04-17 ENCOUNTER — Inpatient Hospital Stay: Payer: 59 | Attending: Oncology | Admitting: Oncology

## 2018-04-17 ENCOUNTER — Telehealth: Payer: Self-pay | Admitting: Oncology

## 2018-04-17 VITALS — BP 112/69 | HR 61 | Temp 98.5°F | Resp 17 | Ht 72.0 in | Wt 160.0 lb

## 2018-04-17 DIAGNOSIS — Z85820 Personal history of malignant melanoma of skin: Secondary | ICD-10-CM | POA: Diagnosis not present

## 2018-04-17 DIAGNOSIS — C4359 Malignant melanoma of other part of trunk: Secondary | ICD-10-CM

## 2018-04-17 DIAGNOSIS — Z79899 Other long term (current) drug therapy: Secondary | ICD-10-CM | POA: Insufficient documentation

## 2018-04-17 LAB — CBC WITH DIFFERENTIAL/PLATELET
BASOS ABS: 0 10*3/uL (ref 0.0–0.1)
BASOS PCT: 1 %
EOS ABS: 0.1 10*3/uL (ref 0.0–0.5)
Eosinophils Relative: 1 %
HCT: 40.2 % (ref 34.8–46.6)
HEMOGLOBIN: 13.3 g/dL (ref 11.6–15.9)
LYMPHS ABS: 1.5 10*3/uL (ref 0.9–3.3)
Lymphocytes Relative: 24 %
MCH: 30.6 pg (ref 25.1–34.0)
MCHC: 33 g/dL (ref 31.5–36.0)
MCV: 92.7 fL (ref 79.5–101.0)
Monocytes Absolute: 0.4 10*3/uL (ref 0.1–0.9)
Monocytes Relative: 7 %
NEUTROS PCT: 67 %
Neutro Abs: 4.1 10*3/uL (ref 1.5–6.5)
PLATELETS: 217 10*3/uL (ref 145–400)
RBC: 4.34 MIL/uL (ref 3.70–5.45)
RDW: 13.5 % (ref 11.2–14.5)
WBC: 6.1 10*3/uL (ref 3.9–10.3)

## 2018-04-17 LAB — COMPREHENSIVE METABOLIC PANEL
ALBUMIN: 4.4 g/dL (ref 3.5–5.0)
ALK PHOS: 61 U/L (ref 40–150)
ALT: 8 U/L (ref 0–55)
AST: 12 U/L (ref 5–34)
Anion gap: 7 (ref 3–11)
BUN: 18 mg/dL (ref 7–26)
CALCIUM: 9.3 mg/dL (ref 8.4–10.4)
CHLORIDE: 105 mmol/L (ref 98–109)
CO2: 30 mmol/L — AB (ref 22–29)
CREATININE: 0.97 mg/dL (ref 0.60–1.10)
GFR calc Af Amer: >60 mL/min (ref >60)
GFR calc non Af Amer: >60 mL/min (ref >60)
Glucose, Bld: 74 mg/dL (ref 70–140)
Potassium: 4.2 mmol/L (ref 3.5–5.1)
SODIUM: 142 mmol/L (ref 136–145)
Total Bilirubin: 0.4 mg/dL (ref 0.2–1.2)
Total Protein: 7 g/dL (ref 6.4–8.3)

## 2018-04-17 LAB — LACTATE DEHYDROGENASE: LDH: 149 U/L (ref 125–245)

## 2018-04-17 NOTE — Progress Notes (Signed)
Hematology and Oncology Follow Up Visit  Lindsey Palmer [id number] 1981-09-19 37 y.o. 04/17/2018 3:01 PM Polite, Lindsey Palmer, MDPolite, Lindsey Moll, MD   Principle Diagnosis: 37 year old woman with T2N1 melanoma of the upper back diagnosed in 2016.  She has no evidence of relapse since that time.   Prior Therapy: She underwent a wide excision and a left axillary lymph node dissection on October 25, 2015 after a positive left axillary sentinel lymph node biopsy.  Her final pathology revealed a stage IIIa with a T2 N1 pathology.  She has been receiving active surveillance since that time.   Current therapy: Active surveillance.  Interim History: Lindsey Palmer presents today for a follow-up.  Since the last visit, she reports no changes in her health.  She denies any recent changes in her performance status or activity level.  She denies any new skin rashes or lesions.  She denies any constitutional symptoms excessive fatigue.  Her exercise ability is muted because of a chronic ankle injury.  She still ambulates without any difficulties and attends activities of daily living.   does not report any headaches, blurry vision, syncope or seizures. Does not report any fevers, chills or sweats.  Does not report any cough, wheezing or hemoptysis.  Does not report any chest pain, palpitation, orthopnea or leg edema.  Does not report any nausea, vomiting or abdominal pain.  Does not report any constipation or diarrhea.  Does not report any skeletal complaints.    Does not report frequency, urgency or hematuria.  Does not report any skin rashes or lesions. Does not report any heat or cold intolerance.  Does not report any lymphadenopathy or petechiae.  Does not report any anxiety or depression.  Remaining review of systems is negative.    Medications: I have reviewed the patient's current medications.  Current Outpatient Medications  Medication Sig Dispense Refill  . rizatriptan (MAXALT-MLT) 10 MG disintegrating tablet    0   No current facility-administered medications for this visit.      Allergies: No Known Allergies  Past Medical History, Surgical history, Social history, and Family History were reviewed and updated.  Review of Systems:   Physical Exam: Blood pressure 112/69, pulse 61, temperature 98.5 F (36.9 C), temperature source Oral, resp. rate 17, height 6' (1.829 m), weight 160 lb (72.6 kg), SpO2 100 %. ECOG: 0 General appearance: Well-appearing woman without distress. Head: Normocephalic, without obvious abnormality Oropharynx: No oral thrush or ulcers. Eyes: No scleral icterus.  Pupils are equal and round reactive to light. Lymph nodes: Cervical, supraclavicular, and axillary nodes normal. Heart:regular rate and rhythm, S1, S2 normal, no murmur, click, rub or gallop Lung:chest clear, no wheezing, rales, normal symmetric air entry Abdomin: soft, non-tender, without masses or organomegaly. Neurological: No motor, sensory deficits.  Intact deep tendon reflexes. Skin: Moist without any ecchymosis or petechiae.  Multiple nevi noted Musculoskeletal: No joint deformity or effusion.     Lab Results: Lab Results  Component Value Date   WBC 6.1 04/17/2018   HGB 13.3 04/17/2018   HCT 40.2 04/17/2018   MCV 92.7 04/17/2018   PLT 217 04/17/2018     Chemistry   No results found for: NA, K, CL, CO2, BUN, CREATININE, GLU No results found for: CALCIUM, ALKPHOS, AST, ALT, BILITOT    Impression and Plan:  37 year old woman with:  1.    T2N1 malignant melanoma of the left upper back diagnosed in November 2016.    She received wide resection and sentinel lymph node sampling with one  lymph node involvement.  She has been on active surveillance at [facility] and reestablish care in in 2018.  Her PET scan obtained in January 2019 was personally reviewed and discussed with the patient today.  She has no evidence of recurrent disease.  The natural course of this disease as well as  surveillance plans were reviewed with the patient today and she is agreeable to continue with active surveillance at this time.  The plan is to repeat PET scan in January 2020 and repeat examination at that time.  2.  Skin exam: I urged her to follow-up with dermatology in the near future to complete her active surveillance.   3.  Follow-up: Will be in January 2020   15  minutes was spent with the patient face-to-face today.  More than 50% of time was dedicated to patient counseling, education and coordination of her multifaceted care.    Lindsey Button, MD 6/6/20193:01 PM

## 2018-04-17 NOTE — Telephone Encounter (Signed)
Appointments scheduled AVS/Calendar printed per 6/6 los °

## 2018-04-25 ENCOUNTER — Encounter: Payer: Self-pay | Admitting: Nurse Practitioner

## 2018-04-25 ENCOUNTER — Ambulatory Visit (INDEPENDENT_AMBULATORY_CARE_PROVIDER_SITE_OTHER): Payer: Self-pay | Admitting: Nurse Practitioner

## 2018-04-25 VITALS — BP 120/68 | HR 68 | Temp 98.7°F | Ht 72.0 in | Wt 160.0 lb

## 2018-04-25 DIAGNOSIS — Z0000 Encounter for general adult medical examination without abnormal findings: Secondary | ICD-10-CM

## 2018-04-25 NOTE — Patient Instructions (Signed)
Health Maintenance, Female Adopting a healthy lifestyle and getting preventive care can go a long way to promote health and wellness. Talk with your health care provider about what schedule of regular examinations is right for you. This is a good chance for you to check in with your provider about disease prevention and staying healthy. In between checkups, there are plenty of things you can do on your own. Experts have done a lot of research about which lifestyle changes and preventive measures are most likely to keep you healthy. Ask your health care provider for more information. Weight and diet Eat a healthy diet  Be sure to include plenty of vegetables, fruits, low-fat dairy products, and lean protein.  Do not eat a lot of foods high in solid fats, added sugars, or salt.  Get regular exercise. This is one of the most important things you can do for your health. ? Most adults should exercise for at least 150 minutes each week. The exercise should increase your heart rate and make you sweat (moderate-intensity exercise). ? Most adults should also do strengthening exercises at least twice a week. This is in addition to the moderate-intensity exercise.  Maintain a healthy weight  Body mass index (BMI) is a measurement that can be used to identify possible weight problems. It estimates body fat based on height and weight. Your health care provider can help determine your BMI and help you achieve or maintain a healthy weight.  For females 20 years of age and older: ? A BMI below 18.5 is considered underweight. ? A BMI of 18.5 to 24.9 is normal. ? A BMI of 25 to 29.9 is considered overweight. ? A BMI of 30 and above is considered obese.  Watch levels of cholesterol and blood lipids  You should start having your blood tested for lipids and cholesterol at 38 years of age, then have this test every 5 years.  You may need to have your cholesterol levels checked more often if: ? Your lipid or  cholesterol levels are high. ? You are older than 38 years of age. ? You are at high risk for heart disease.  Cancer screening Lung Cancer  Lung cancer screening is recommended for adults 55-80 years old who are at high risk for lung cancer because of a history of smoking.  A yearly low-dose CT scan of the lungs is recommended for people who: ? Currently smoke. ? Have quit within the past 15 years. ? Have at least a 30-pack-year history of smoking. A pack year is smoking an average of one pack of cigarettes a day for 1 year.  Yearly screening should continue until it has been 15 years since you quit.  Yearly screening should stop if you develop a health problem that would prevent you from having lung cancer treatment.  Breast Cancer  Practice breast self-awareness. This means understanding how your breasts normally appear and feel.  It also means doing regular breast self-exams. Let your health care provider know about any changes, no matter how small.  If you are in your 20s or 30s, you should have a clinical breast exam (CBE) by a health care provider every 1-3 years as part of a regular health exam.  If you are 40 or older, have a CBE every year. Also consider having a breast X-ray (mammogram) every year.  If you have a family history of breast cancer, talk to your health care provider about genetic screening.  If you are at high risk   for breast cancer, talk to your health care provider about having an MRI and a mammogram every year.  Breast cancer gene (BRCA) assessment is recommended for women who have family members with BRCA-related cancers. BRCA-related cancers include: ? Breast. ? Ovarian. ? Tubal. ? Peritoneal cancers.  Results of the assessment will determine the need for genetic counseling and BRCA1 and BRCA2 testing.  Cervical Cancer Your health care provider may recommend that you be screened regularly for cancer of the pelvic organs (ovaries, uterus, and  vagina). This screening involves a pelvic examination, including checking for microscopic changes to the surface of your cervix (Pap test). You may be encouraged to have this screening done every 3 years, beginning at age 22.  For women ages 56-65, health care providers may recommend pelvic exams and Pap testing every 3 years, or they may recommend the Pap and pelvic exam, combined with testing for human papilloma virus (HPV), every 5 years. Some types of HPV increase your risk of cervical cancer. Testing for HPV may also be done on women of any age with unclear Pap test results.  Other health care providers may not recommend any screening for nonpregnant women who are considered low risk for pelvic cancer and who do not have symptoms. Ask your health care provider if a screening pelvic exam is right for you.  If you have had past treatment for cervical cancer or a condition that could lead to cancer, you need Pap tests and screening for cancer for at least 20 years after your treatment. If Pap tests have been discontinued, your risk factors (such as having a new sexual partner) need to be reassessed to determine if screening should resume. Some women have medical problems that increase the chance of getting cervical cancer. In these cases, your health care provider may recommend more frequent screening and Pap tests.  Colorectal Cancer  This type of cancer can be detected and often prevented.  Routine colorectal cancer screening usually begins at 38 years of age and continues through 38 years of age.  Your health care provider may recommend screening at an earlier age if you have risk factors for colon cancer.  Your health care provider may also recommend using home test kits to check for hidden blood in the stool.  A small camera at the end of a tube can be used to examine your colon directly (sigmoidoscopy or colonoscopy). This is done to check for the earliest forms of colorectal  cancer.  Routine screening usually begins at age 33.  Direct examination of the colon should be repeated every 5-10 years through 38 years of age. However, you may need to be screened more often if early forms of precancerous polyps or small growths are found.  Skin Cancer  Check your skin from head to toe regularly.  Tell your health care provider about any new moles or changes in moles, especially if there is a change in a mole's shape or color.  Also tell your health care provider if you have a mole that is larger than the size of a pencil eraser.  Always use sunscreen. Apply sunscreen liberally and repeatedly throughout the day.  Protect yourself by wearing long sleeves, pants, a wide-brimmed hat, and sunglasses whenever you are outside.  Heart disease, diabetes, and high blood pressure  High blood pressure causes heart disease and increases the risk of stroke. High blood pressure is more likely to develop in: ? People who have blood pressure in the high end of  the normal range (130-139/85-89 mm Hg). ? People who are overweight or obese. ? People who are African American.  If you are 21-29 years of age, have your blood pressure checked every 3-5 years. If you are 3 years of age or older, have your blood pressure checked every year. You should have your blood pressure measured twice-once when you are at a hospital or clinic, and once when you are not at a hospital or clinic. Record the average of the two measurements. To check your blood pressure when you are not at a hospital or clinic, you can use: ? An automated blood pressure machine at a pharmacy. ? A home blood pressure monitor.  If you are between 17 years and 37 years old, ask your health care provider if you should take aspirin to prevent strokes.  Have regular diabetes screenings. This involves taking a blood sample to check your fasting blood sugar level. ? If you are at a normal weight and have a low risk for diabetes,  have this test once every three years after 38 years of age. ? If you are overweight and have a high risk for diabetes, consider being tested at a younger age or more often. Preventing infection Hepatitis B  If you have a higher risk for hepatitis B, you should be screened for this virus. You are considered at high risk for hepatitis B if: ? You were born in a country where hepatitis B is common. Ask your health care provider which countries are considered high risk. ? Your parents were born in a high-risk country, and you have not been immunized against hepatitis B (hepatitis B vaccine). ? You have HIV or AIDS. ? You use needles to inject street drugs. ? You live with someone who has hepatitis B. ? You have had sex with someone who has hepatitis B. ? You get hemodialysis treatment. ? You take certain medicines for conditions, including cancer, organ transplantation, and autoimmune conditions.  Hepatitis C  Blood testing is recommended for: ? Everyone born from [birth year] through 1966. ? Anyone with known risk factors for hepatitis C.  Sexually transmitted infections (STIs)  You should be screened for sexually transmitted infections (STIs) including gonorrhea and chlamydia if: ? You are sexually active and are younger than 38 years of age. ? You are older than 38 years of age and your health care provider tells you that you are at risk for this type of infection. ? Your sexual activity has changed since you were last screened and you are at an increased risk for chlamydia or gonorrhea. Ask your health care provider if you are at risk.  If you do not have HIV, but are at risk, it may be recommended that you take a prescription medicine daily to prevent HIV infection. This is called pre-exposure prophylaxis (PrEP). You are considered at risk if: ? You are sexually active and do not regularly use condoms or know the HIV status of your partner(s). ? You take drugs by injection. ? You are  sexually active with a partner who has HIV.  Talk with your health care provider about whether you are at high risk of being infected with HIV. If you choose to begin PrEP, you should first be tested for HIV. You should then be tested every 3 months for as long as you are taking PrEP. Pregnancy  If you are premenopausal and you may become pregnant, ask your health care provider about preconception counseling.  If you may become  pregnant, take 400 to 800 micrograms (mcg) of folic acid every day.  If you want to prevent pregnancy, talk to your health care provider about birth control (contraception). Osteoporosis and menopause  Osteoporosis is a disease in which the bones lose minerals and strength with aging. This can result in serious bone fractures. Your risk for osteoporosis can be identified using a bone density scan.  If you are 65 years of age or older, or if you are at risk for osteoporosis and fractures, ask your health care provider if you should be screened.  Ask your health care provider whether you should take a calcium or vitamin D supplement to lower your risk for osteoporosis.  Menopause may have certain physical symptoms and risks.  Hormone replacement therapy may reduce some of these symptoms and risks. Talk to your health care provider about whether hormone replacement therapy is right for you. Follow these instructions at home:  Schedule regular health, dental, and eye exams.  Stay current with your immunizations.  Do not use any tobacco products including cigarettes, chewing tobacco, or electronic cigarettes.  If you are pregnant, do not drink alcohol.  If you are breastfeeding, limit how much and how often you drink alcohol.  Limit alcohol intake to no more than 1 drink per day for nonpregnant women. One drink equals 12 ounces of beer, 5 ounces of wine, or 1 ounces of hard liquor.  Do not use street drugs.  Do not share needles.  Ask your health care  provider for help if you need support or information about quitting drugs.  Tell your health care provider if you often feel depressed.  Tell your health care provider if you have ever been abused or do not feel safe at home. This information is not intended to replace advice given to you by your health care provider. Make sure you discuss any questions you have with your health care provider. Document Released: 05/14/2011 Document Revised: 04/05/2016 Document Reviewed: 08/02/2015 Elsevier Interactive Patient Education  2018 [employer]  Preventive Care 18-39 Years, Female Preventive care refers to lifestyle choices and visits with your health care provider that can promote health and wellness. What does preventive care include?  A yearly physical exam. This is also called an annual well check.  Dental exams once or twice a year.  Routine eye exams. Ask your health care provider how often you should have your eyes checked.  Personal lifestyle choices, including: ? Daily care of your teeth and gums. ? Regular physical activity. ? Eating a healthy diet. ? Avoiding tobacco and drug use. ? Limiting alcohol use. ? Practicing safe sex. ? Taking vitamin and mineral supplements as recommended by your health care provider. What happens during an annual well check? The services and screenings done by your health care provider during your annual well check will depend on your age, overall health, lifestyle risk factors, and family history of disease. Counseling Your health care provider may ask you questions about your:  Alcohol use.  Tobacco use.  Drug use.  Emotional well-being.  Home and relationship well-being.  Sexual activity.  Eating habits.  Work and work environment.  Method of birth control.  Menstrual cycle.  Pregnancy history.  Screening You may have the following tests or measurements:  Height, weight, and BMI.  Diabetes screening. This is done by  checking your blood sugar (glucose) after you have not eaten for a while (fasting).  Blood pressure.  Lipid and cholesterol levels. These may be checked   every 5 years starting at age 76.  Skin check.  Hepatitis C blood test.  Hepatitis B blood test.  Sexually transmitted disease (STD) testing.  BRCA-related cancer screening. This may be done if you have a family history of breast, ovarian, tubal, or peritoneal cancers.  Pelvic exam and Pap test. This may be done every 3 years starting at age 64. Starting at age 64, this may be done every 5 years if you have a Pap test in combination with an HPV test.  Discuss your test results, treatment options, and if necessary, the need for more tests with your health care provider. Vaccines Your health care provider may recommend certain vaccines, such as:  Influenza vaccine. This is recommended every year.  Tetanus, diphtheria, and acellular pertussis (Tdap, Td) vaccine. You may need a Td booster every 10 years.  Varicella vaccine. You may need this if you have not been vaccinated.  HPV vaccine. If you are 48 or younger, you may need three doses over 6 months.  Measles, mumps, and rubella (MMR) vaccine. You may need at least one dose of MMR. You may also need a second dose.  Pneumococcal 13-valent conjugate (PCV13) vaccine. You may need this if you have certain conditions and were not previously vaccinated.  Pneumococcal polysaccharide (PPSV23) vaccine. You may need one or two doses if you smoke cigarettes or if you have certain conditions.  Meningococcal vaccine. One dose is recommended if you are age 16-21 years and a first-year college student living in a residence hall, or if you have one of several medical conditions. You may also need additional booster doses.  Hepatitis A vaccine. You may need this if you have certain conditions or if you travel or work in places where you may be exposed to hepatitis A.  Hepatitis B vaccine. You  may need this if you have certain conditions or if you travel or work in places where you may be exposed to hepatitis B.  Haemophilus influenzae type b (Hib) vaccine. You may need this if you have certain risk factors.  Talk to your health care provider about which screenings and vaccines you need and how often you need them. This information is not intended to replace advice given to you by your health care provider. Make sure you discuss any questions you have with your health care provider. Document Released: 12/25/2001 Document Revised: 07/18/2016 Document Reviewed: 08/30/2015 Elsevier Interactive Patient Education  [employer].

## 2018-04-25 NOTE — Progress Notes (Addendum)
Subjective:  Lindsey Palmer is a 37 y.o. female who presents for basic physical exam.  Patient is presenting for a health assessment as she is covered by [employer], which will help keep her premiums low.  The patient denies any current health related concerns.  The patient denies any history of heart disease, lung disease, kidney disease, diabetes, seizures, asthma, or hypertension.  The patient does not take any medications on a daily basis, and she is not allergic to any medications.  Patient's last menstrual cycle was about 3-1/2 weeks ago.  The patient states her cycle is about every 4-1/2 to 5 weeks regularly.    Patient is married and lives at home with HER-2 boys.  The patient denies the use of recreational drugs, and does not smoke.  The patient does drink socially.  The patient has a family history of breast cancer on her mother's side and her mom maternal grandmother, and a history of macular degeneration on her father's side.  The patient's surgical history is correct below.  Immunization History  Administered Date(s) Administered  . Tdap 06/09/2016    Past Medical History:  Diagnosis Date  . Abnormal Pap smear   . H/O varicella   . Headache(784.0)   . Melanoma ([place])    stage3  . Urinary complications     Past Surgical History:  Procedure Laterality Date  . ANKLE SURGERY    . DILATION AND CURETTAGE OF UTERUS    . lymphnode disection Left     Social History   Tobacco Use  . Smoking status: Never Smoker  . Smokeless tobacco: Never Used  Substance Use Topics  . Alcohol use: Yes    Comment: occ  . Drug use: No    No Known Allergies  Current Outpatient Medications  Medication Sig Dispense Refill  . rizatriptan (MAXALT-MLT) 10 MG disintegrating tablet   0   No current facility-administered medications for this visit.     Review of Systems  Constitutional: Negative.   HENT: Negative.   Eyes: Negative.   Respiratory: Negative.   Cardiovascular: Negative.    Gastrointestinal: Negative.   Genitourinary: Negative.   Musculoskeletal: Negative.   Skin: Negative.   Neurological: Negative.   Endo/Heme/Allergies: Negative.   Psychiatric/Behavioral: Negative.     Objective:  BP 120/68   Pulse 68   Temp 98.7 F (37.1 C)   Ht 6' (1.829 m)   Wt 160 lb (72.6 kg)   SpO2 99%   BMI 21.70 kg/m   General Appearance:  Alert, cooperative, no distress, appears stated age  Head:  Normocephalic, without obvious abnormality, atraumatic  Eyes:  PERRL, conjunctiva/corneas clear, EOM's intact, fundi benign, both eyes  Ears:  Normal TM's and external ear canals, both ears  Nose: Nares normal, septum midline,mucosa normal, no drainage or sinus tenderness  Throat: Lips, mucosa, and tongue normal; teeth and gums normal  Neck: Supple, symmetrical, trachea midline, no adenopathy;  thyroid: not enlarged, symmetric, no tenderness/mass/nodules; no carotid bruit or JVD  Back:   Symmetric, no curvature, ROM normal, no CVA tenderness  Lungs:   Clear to auscultation bilaterally, respirations unlabored  Breasts:  Deferred  Heart:  Regular rate and rhythm, S1 and S2 normal, no murmur, rub, or gallop  Abdomen:   Soft, non-tender, bowel sounds active all four quadrants,  no masses, no organomegaly  Pelvic: Deferred  Extremities: Extremities normal, atraumatic, no cyanosis or edema  Pulses: 2+ and symmetric  Skin: Skin color, texture, turgor normal, no rashes or lesions  Lymph nodes: Cervical, supraclavicular nodes normal  Neurologic: Normal     Assessment:  basic physical exam    Plan:  Patient education provided.  The patient will follow up with her PCP for lab work, and any further work-up and evaluation.  The patient was given education on health maintenance and health prevention for her age group.  The patient verbalizes understanding and has no questions at time of discharge.

## 2018-05-01 MED FILL — RIZATRIPTAN 10 MG ODT: 10 | 30 days supply | Qty: 12 | Fill #1

## 2018-05-29 DIAGNOSIS — Z1329 Encounter for screening for other suspected endocrine disorder: Secondary | ICD-10-CM | POA: Diagnosis not present

## 2018-05-29 DIAGNOSIS — Z1151 Encounter for screening for human papillomavirus (HPV): Secondary | ICD-10-CM | POA: Diagnosis not present

## 2018-05-29 DIAGNOSIS — Z0000 Encounter for general adult medical examination without abnormal findings: Secondary | ICD-10-CM | POA: Diagnosis not present

## 2018-05-29 DIAGNOSIS — Z130 Encounter for screening for diseases of the blood and blood-forming organs and certain disorders involving the immune mechanism: Secondary | ICD-10-CM | POA: Diagnosis not present

## 2018-05-29 DIAGNOSIS — Z6821 Body mass index (BMI) 21.0-21.9, adult: Secondary | ICD-10-CM | POA: Diagnosis not present

## 2018-05-29 DIAGNOSIS — Z131 Encounter for screening for diabetes mellitus: Secondary | ICD-10-CM | POA: Diagnosis not present

## 2018-05-29 DIAGNOSIS — Z13220 Encounter for screening for lipoid disorders: Secondary | ICD-10-CM | POA: Diagnosis not present

## 2018-05-29 DIAGNOSIS — Z01419 Encounter for gynecological examination (general) (routine) without abnormal findings: Secondary | ICD-10-CM | POA: Diagnosis not present

## 2018-07-15 DIAGNOSIS — D239 Other benign neoplasm of skin, unspecified: Secondary | ICD-10-CM | POA: Diagnosis not present

## 2018-08-01 MED FILL — RIZATRIPTAN 10 MG ODT: 10 | 30 days supply | Qty: 12 | Fill #2

## 2018-10-31 MED FILL — RIZATRIPTAN 10 MG ODT: 10 | 30 days supply | Qty: 12 | Fill #3

## 2018-11-11 ENCOUNTER — Telehealth: Payer: Self-pay | Admitting: Oncology

## 2018-11-11 NOTE — Telephone Encounter (Signed)
Call pt per 12/31 sch message - left message for patient to call back to r/s

## 2018-11-13 ENCOUNTER — Telehealth: Payer: Self-pay | Admitting: *Deleted

## 2018-11-13 ENCOUNTER — Inpatient Hospital Stay: Payer: 59

## 2018-11-13 NOTE — Telephone Encounter (Signed)
Patient calling to say her PET scan was moved to 12/01/18. Will need to move lab and dr visit after 12/01/18. Schedule message sent to schedulers.

## 2018-11-14 ENCOUNTER — Inpatient Hospital Stay: Payer: 59 | Admitting: Oncology

## 2018-12-01 ENCOUNTER — Encounter (HOSPITAL_COMMUNITY)
Admission: RE | Admit: 2018-12-01 | Discharge: 2018-12-01 | Disposition: A | Discharge status: Home / Self Care | Payer: No Typology Code available for payment source | Source: Ambulatory Visit | Attending: Oncology | Admitting: Oncology

## 2018-12-01 DIAGNOSIS — C4359 Malignant melanoma of other part of trunk: Secondary | ICD-10-CM | POA: Diagnosis not present

## 2018-12-01 LAB — GLUCOSE, CAPILLARY: Glucose-Capillary: 78 mg/dL (ref 70–99)

## 2018-12-01 MED ORDER — FLUDEOXYGLUCOSE F - 18 (FDG) INJECTION
8.2800 | Freq: Once | INTRAVENOUS | Status: AC | PRN
  Administered 2018-12-01: 8.28 via INTRAVENOUS

## 2018-12-03 ENCOUNTER — Telehealth: Payer: Self-pay

## 2018-12-03 NOTE — Telephone Encounter (Signed)
Received a message from patient requesting to know the PET scan results prior to the next scheduled MD appointment. Contacted patient and made aware that the PET scan is normal per Dr. Alen Blew. Patient appreciative of the call and no other questions or concerns.

## 2018-12-16 ENCOUNTER — Telehealth: Payer: Self-pay | Admitting: Oncology

## 2018-12-16 ENCOUNTER — Inpatient Hospital Stay: Payer: No Typology Code available for payment source | Attending: Oncology | Admitting: Oncology

## 2018-12-16 VITALS — BP 112/69 | HR 70 | Temp 98.1°F | Resp 17 | Ht 72.0 in | Wt 166.0 lb

## 2018-12-16 DIAGNOSIS — N83202 Unspecified ovarian cyst, left side: Secondary | ICD-10-CM | POA: Insufficient documentation

## 2018-12-16 DIAGNOSIS — Z79899 Other long term (current) drug therapy: Secondary | ICD-10-CM | POA: Insufficient documentation

## 2018-12-16 DIAGNOSIS — C4359 Malignant melanoma of other part of trunk: Secondary | ICD-10-CM

## 2018-12-16 DIAGNOSIS — Z85820 Personal history of malignant melanoma of skin: Secondary | ICD-10-CM | POA: Insufficient documentation

## 2018-12-16 NOTE — Progress Notes (Signed)
Hematology and Oncology Follow Up Visit  Lindsey Palmer [id number] [birth year] 38 y.o. 12/16/2018 8:34 AM Polite, [name], MDPolite, [name], MD   Principle Diagnosis: 38 year old woman with cutaneous melanoma diagnosed in 2016.  She was found to have T2N1 tumor of the upper back.    Prior Therapy: She underwent a wide excision and a left axillary lymph node dissection on October 25, 2015 after a positive left axillary sentinel lymph node biopsy.  Her final pathology revealed a stage IIIa with a T2 N1 pathology.  She has been receiving active surveillance since that time.   Current therapy: Active surveillance.  Interim History: Ms. Rasch is here for a repeat evaluation.  Since the last visit, she reports no major changes in her health.  She continues to be active and attends activities of daily living.  She denies any recent hospitalizations or illnesses.  She continues to have periodic dermatology surveillance every 6 months.  No rashes or irritation.  No constitutional symptoms.  Patient denied any alteration mental status, neuropathy, confusion or dizziness.  Denies any headaches or lethargy.  Denies any night sweats, weight loss or changes in appetite.  Denied orthopnea, dyspnea on exertion or chest discomfort.  Denies shortness of breath, difficulty breathing hemoptysis or cough.  Denies any abdominal distention, nausea, early satiety or dyspepsia.  Denies any hematuria, frequency, dysuria or nocturia.  Denies any skin irritation, dryness or rash.  Denies any ecchymosis or petechiae.  Denies any lymphadenopathy or clotting.  Denies any heat or cold intolerance.  Denies any anxiety or depression.  Remaining review of system is negative.         Medications: I have reviewed the patient's current medications.  Current Outpatient Medications  Medication Sig Dispense Refill  . rizatriptan (MAXALT-MLT) 10 MG disintegrating tablet   0   No current facility-administered medications for this  visit.      Allergies: No Known Allergies  Past Medical History, Surgical history, Social history, and Family History were reviewed and updated.     Physical Exam: Blood pressure 112/69, pulse 70, temperature 98.1 F (36.7 C), temperature source Oral, resp. rate 17, height 6' (1.829 m), weight 166 lb (75.3 kg), last menstrual period 11/29/2018, SpO2 100 %.    ECOG: 0   General appearance: Alert, awake without any distress. Head: Atraumatic without abnormalities Oropharynx: Without any thrush or ulcers. Eyes: No scleral icterus. Lymph nodes: No lymphadenopathy noted in the cervical, supraclavicular, or axillary nodes Heart:regular rate and rhythm, without any murmurs or gallops.   Lung: Clear to auscultation without any rhonchi, wheezes or dullness to percussion. Abdomin: Soft, nontender without any shifting dullness or ascites. Musculoskeletal: No clubbing or cyanosis. Neurological: No motor or sensory deficits. Skin: No rashes or lesions. Psychiatric: Mood and affect appeared normal.       Lab Results: Lab Results  Component Value Date   WBC 6.1 04/17/2018   HGB 13.3 04/17/2018   HCT 40.2 04/17/2018   MCV 92.7 04/17/2018   PLT 217 04/17/2018     Chemistry      Component Value Date/Time   NA 142 04/17/2018 1443   K 4.2 04/17/2018 1443   CL 105 04/17/2018 1443   CO2 30 (H) 04/17/2018 1443   BUN 18 04/17/2018 1443   CREATININE 0.97 04/17/2018 1443      Component Value Date/Time   CALCIUM 9.3 04/17/2018 1443   ALKPHOS 61 04/17/2018 1443   AST 12 04/17/2018 1443   ALT 8 04/17/2018 1443   BILITOT 0.4  04/17/2018 1443     EXAM: NUCLEAR MEDICINE PET WHOLE BODY  TECHNIQUE: 8.3 mCi F-18 FDG was injected intravenously. Full-ring PET imaging was performed from the skull base to thigh after the radiotracer. CT data was obtained and used for attenuation correction and anatomic localization.  Fasting blood glucose: 78 mg/dl  COMPARISON:   11/14/2017  FINDINGS: Mediastinal blood pool activity: SUV max 2.1  HEAD/NECK: No significant abnormal hypermetabolic activity in this region.  Incidental CT findings: none  CHEST: No significant abnormal hypermetabolic activity in this region.  Incidental CT findings: Prior left axillary dissection.  ABDOMEN/PELVIS: Accentuated activity along the endometrium is thought to be physiologic. Bilobed cystic lesion of the left ovary with some complexity, but no accentuated abnormal activity.  Incidental CT findings: none  SKELETON: No significant abnormal hypermetabolic activity in this region.  Incidental CT findings: Postoperative findings in the left ankle.  EXTREMITIES: Physiologic activity in the anterior compartment of the left lower lobe. Physiologic activity in both forearms.  Incidental CT findings: none  IMPRESSION: 1. No current findings of active malignancy. 2. Bilobed cystic lesion left ovary with some complexity, but no accentuated abnormal activity. This could be further characterized and surveilled by pelvic ultrasound if clinically warranted.    Impression and Plan:  38 year old woman with:  1.    Cutaneous melanoma of the left upper back diagnosed in 2016.  She was found to have T2N1 at that time.   She is status post surgical resection without any evidence of disease at this time.  The natural course of her melanoma as well as risk of relapse was assessed again.  PET CT scan obtained on December 01, 2018 was personally reviewed and discussed with the patient.  She continues to have no evidence of disease at this time.  Plan is to continue active surveillance with a repeat exam in 6 months and imaging study in 12 months.  No further follow-up will be needed beyond at 2022.  2.    Dermatology surveillance: She remains up-to-date at this time.  I continue to emphasize the importance of skin protection moving forward.  3.  Follow-up: Will be  in 6 months.  15  minutes was spent with the patient face-to-face today.  More than 50% of time was dedicated to reviewing disease status, imaging studies review and answering questions regarding future plan of care.    Zola Button, MD 2/4/20208:34 AM

## 2018-12-16 NOTE — Telephone Encounter (Signed)
Scheduled appt per 02/04 los.  Patient declined calendar and avs.

## 2019-01-26 MED FILL — RIZATRIPTAN 10 MG ODT: 10 | 30 days supply | Qty: 12 | Fill #0

## 2019-02-03 ENCOUNTER — Encounter: Payer: Self-pay | Admitting: Physician Assistant

## 2019-02-03 ENCOUNTER — Telehealth: Payer: No Typology Code available for payment source | Admitting: Physician Assistant

## 2019-02-03 DIAGNOSIS — J069 Acute upper respiratory infection, unspecified: Secondary | ICD-10-CM | POA: Diagnosis not present

## 2019-02-03 MED ORDER — FLUTICASONE PROPIONATE 50 MCG/ACT NA SUSP: 2.0000 | Freq: Every day | NASAL | 0 refills | Status: DC

## 2019-02-03 MED ORDER — BENZONATATE 100 MG PO CAPS: 100.0000 mg | ORAL_CAPSULE | Freq: Three times a day (TID) | ORAL | 0 refills | Status: DC | PRN

## 2019-02-03 NOTE — Progress Notes (Signed)
We are sorry that you are not feeling well.  Here is how we plan to help!  Based on your presentation I believe you most likely have  An upper respiratory infection    In addition you may use A prescription cough medication called Tessalon Perles 100mg . You may take 1-2 capsules every 8 hours as needed for your cough.  I have also prescribed Flonase, a steroid base nasal spray, use 2 sprays in each nostril once daily.   You may take over the counter Cepacol throat lozenges to help with sore throat.   From your responses in the eVisit questionnaire you describe inflammation in the upper respiratory tract which is causing a significant cough.  This is commonly called Bronchitis and has four common causes:    Allergies  Viral Infections  Acid Reflux  Bacterial Infection Allergies, viruses and acid reflux are treated by controlling symptoms or eliminating the cause. An example might be a cough caused by taking certain blood pressure medications. You stop the cough by changing the medication. Another example might be a cough caused by acid reflux. Controlling the reflux helps control the cough.  USE OF BRONCHODILATOR ("RESCUE") INHALERS: There is a risk from using your bronchodilator too frequently.  The risk is that over-reliance on a medication which only relaxes the muscles surrounding the breathing tubes can reduce the effectiveness of medications prescribed to reduce swelling and congestion of the tubes themselves.  Although you feel brief relief from the bronchodilator inhaler, your asthma may actually be worsening with the tubes becoming more swollen and filled with mucus.  This can delay other crucial treatments, such as oral steroid medications. If you need to use a bronchodilator inhaler daily, several times per day, you should discuss this with your provider.  There are probably better treatments that could be used to keep your asthma under control.     HOME CARE . Only take  medications as instructed by your medical team. . Complete the entire course of an antibiotic. . Drink plenty of fluids and get plenty of rest. . Avoid close contacts especially the very young and the elderly . Cover your mouth if you cough or cough into your sleeve. . Always remember to wash your hands . A steam or ultrasonic humidifier can help congestion.   GET HELP RIGHT AWAY IF: . You develop worsening fever. . You become short of breath . You cough up blood. . Your symptoms persist after you have completed your treatment plan MAKE SURE YOU   Understand these instructions.  Will watch your condition.  Will get help right away if you are not doing well or get worse.  Your e-visit answers were reviewed by a board certified advanced clinical practitioner to complete your personal care plan.  Depending on the condition, your plan could have included both over the counter or prescription medications. If there is a problem please reply  once you have received a response from your provider. Your safety is important to [place].  If you have drug allergies check your prescription carefully.    You can use MyChart to ask questions about today's visit, request a non-urgent call back, or ask for a work or school excuse for 24 hours related to this e-Visit. If it has been greater than 24 hours you will need to follow up with your provider, or enter a new e-Visit to address those concerns. You will get an e-mail in the next two days asking about your experience.  I  hope that your e-visit has been valuable and will speed your recovery. Thank you for using e-visits.  I have spent 7 min in completion and review of this note- Lacy Duverney [facility]

## 2019-04-16 ENCOUNTER — Encounter: Payer: Self-pay | Admitting: Oncology

## 2019-05-09 MED FILL — RIZATRIPTAN 10 MG ODT: 10 | 30 days supply | Qty: 12 | Fill #1

## 2019-06-16 ENCOUNTER — Inpatient Hospital Stay (HOSPITAL_BASED_OUTPATIENT_CLINIC_OR_DEPARTMENT_OTHER): Payer: No Typology Code available for payment source | Admitting: Oncology

## 2019-06-16 ENCOUNTER — Inpatient Hospital Stay: Payer: No Typology Code available for payment source | Attending: Oncology

## 2019-06-16 ENCOUNTER — Telehealth: Payer: Self-pay | Admitting: Oncology

## 2019-06-16 ENCOUNTER — Other Ambulatory Visit: Payer: Self-pay

## 2019-06-16 VITALS — BP 118/84 | HR 60 | Temp 98.2°F | Resp 17 | Ht 72.0 in | Wt 164.4 lb

## 2019-06-16 DIAGNOSIS — Z85820 Personal history of malignant melanoma of skin: Secondary | ICD-10-CM | POA: Diagnosis present

## 2019-06-16 DIAGNOSIS — Z79899 Other long term (current) drug therapy: Secondary | ICD-10-CM | POA: Insufficient documentation

## 2019-06-16 DIAGNOSIS — C4359 Malignant melanoma of other part of trunk: Secondary | ICD-10-CM

## 2019-06-16 LAB — CMP (CANCER CENTER ONLY)
ALT: 9 U/L (ref 0–44)
AST: 14 U/L — ABNORMAL LOW (ref 15–41)
Albumin: 4.2 g/dL (ref 3.5–5.0)
Alkaline Phosphatase: 66 U/L (ref 38–126)
Anion gap: 8 (ref 5–15)
BUN: 20 mg/dL (ref 6–20)
CO2: 28 mmol/L (ref 22–32)
Calcium: 9.5 mg/dL (ref 8.9–10.3)
Chloride: 104 mmol/L (ref 98–111)
Creatinine: 0.89 mg/dL (ref 0.44–1.00)
GFR, Est AFR Am: >60 mL/min (ref >60)
GFR, Estimated: >60 mL/min (ref >60)
Glucose, Bld: 87 mg/dL (ref 70–99)
Potassium: 4 mmol/L (ref 3.5–5.1)
Sodium: 140 mmol/L (ref 135–145)
Total Bilirubin: 0.6 mg/dL (ref 0.3–1.2)
Total Protein: 7.2 g/dL (ref 6.5–8.1)

## 2019-06-16 LAB — CBC WITH DIFFERENTIAL (CANCER CENTER ONLY)
Abs Immature Granulocytes: 0.01 10*3/uL (ref 0.00–0.07)
Basophils Absolute: 0 10*3/uL (ref 0.0–0.1)
Basophils Relative: 0 %
Eosinophils Absolute: 0.1 10*3/uL (ref 0.0–0.5)
Eosinophils Relative: 2 %
HCT: 39.4 % (ref 36.0–46.0)
Hemoglobin: 13.2 g/dL (ref 12.0–15.0)
Immature Granulocytes: 0 %
Lymphocytes Relative: 25 %
Lymphs Abs: 1.1 10*3/uL (ref 0.7–4.0)
MCH: 30.5 pg (ref 26.0–34.0)
MCHC: 33.5 g/dL (ref 30.0–36.0)
MCV: 91 fL (ref 80.0–100.0)
Monocytes Absolute: 0.4 10*3/uL (ref 0.1–1.0)
Monocytes Relative: 9 %
Neutro Abs: 3 10*3/uL (ref 1.7–7.7)
Neutrophils Relative %: 64 %
Platelet Count: 237 10*3/uL (ref 150–400)
RBC: 4.33 MIL/uL (ref 3.87–5.11)
RDW: 12.6 % (ref 11.5–15.5)
WBC Count: 4.7 10*3/uL (ref 4.0–10.5)
nRBC: 0 % (ref 0.0–0.2)

## 2019-06-16 NOTE — Progress Notes (Signed)
Hematology and Oncology Follow Up Visit  Lindsey Palmer [id number] 09/19/1981 38 y.o. 06/16/2019 9:28 AM Lindsey Palmer, Lindsey Palmer, MDPolite, Lindsey Moll, MD   Principle Diagnosis: 38 year old woman with T2N1 melanoma of the upper back diagnosed in 2016.      Prior Therapy: She underwent a wide excision and a left axillary lymph node dissection on October 25, 2015 after a positive left axillary sentinel lymph node biopsy.  Her final pathology revealed a stage IIIa with a T2 N1 pathology.  She has been receiving active surveillance since that time.   Current therapy: Active surveillance.  Interim History: Lindsey Palmer presents today for a follow-up.  Since the last visit, she denies any recent changes or complaints.  He remains active and attends activities of daily living.  She denies any new skin rashes or lesions.  She denies any weight changes or hospitalizations.    He denied headaches, blurry vision, syncope or seizures.  Denies any fevers, chills or sweats.  Denied chest pain, palpitation, orthopnea or leg edema.  Denied cough, wheezing or hemoptysis.  Denied nausea, vomiting or abdominal pain.  Denies any constipation or diarrhea.  Denies any frequency urgency or hesitancy.  Denies any arthralgias or myalgias.  Denies any skin rashes or lesions.  Denies any bleeding or clotting tendency.  Denies any easy bruising.  Denies any hair or nail changes.  Denies any anxiety or depression.  Remaining review of system is negative.          Medications: Updated without changes. Current Outpatient Medications  Medication Sig Dispense Refill  . benzonatate (TESSALON) 100 MG capsule Take 1-2 capsules (100-200 mg total) by mouth 3 (three) times daily as needed for cough. 20 capsule 0  . fluticasone (FLONASE) 50 MCG/ACT nasal spray Place 2 sprays into both nostrils daily. 16 g 0  . rizatriptan (MAXALT-MLT) 10 MG disintegrating tablet   0   No current facility-administered medications for this visit.       Allergies: No Known Allergies  Past Medical History, Surgical history, Social history, and Family History are unchanged on review.     Physical Exam:   Blood pressure 118/84, pulse 60, temperature 98.2 F (36.8 C), temperature source Oral, resp. rate 17, height 6' (1.829 m), weight 164 lb 6.4 oz (74.6 kg), SpO2 100 %.   ECOG: 0     General appearance: Comfortable appearing without any discomfort Head: Normocephalic without any trauma Oropharynx: Mucous membranes are moist and pink without any thrush or ulcers. Eyes: Pupils are equal and round reactive to light. Lymph nodes: No cervical, supraclavicular, inguinal or axillary lymphadenopathy.   Heart:regular rate and rhythm.  S1 and S2 without leg edema. Lung: Clear without any rhonchi or wheezes.  No dullness to percussion. Abdomin: Soft, nontender, nondistended with good bowel sounds.  No hepatosplenomegaly. Musculoskeletal: No joint deformity or effusion.  Full range of motion noted. Neurological: No deficits noted on motor, sensory and deep tendon reflex exam. Skin: No petechial rash or dryness.  Appeared moist.         Lab Results: Lab Results  Component Value Date   WBC 6.1 04/17/2018   HGB 13.3 04/17/2018   HCT 40.2 04/17/2018   MCV 92.7 04/17/2018   PLT 217 04/17/2018     Chemistry      Component Value Date/Time   NA 142 04/17/2018 1443   K 4.2 04/17/2018 1443   CL 105 04/17/2018 1443   CO2 30 (H) 04/17/2018 1443   BUN 18 04/17/2018 1443   CREATININE  0.97 04/17/2018 1443      Component Value Date/Time   CALCIUM 9.3 04/17/2018 1443   ALKPHOS 61 04/17/2018 1443   AST 12 04/17/2018 1443   ALT 8 04/17/2018 1443   BILITOT 0.4 04/17/2018 1443      Impression and Plan:  38 year old woman with:  1.    T2N1 melanoma of the left upper back diagnosed in 2016.  Remains disease-free after surgical resection.  The natural course of this disease was outlined today and risk of relapse was  reiterated.  PET scan obtained on anywhere 20th was discussed and showed no evidence of disease relapse.  I recommended continued active surveillance with repeat imaging studies in 6 months.The role for systemic therapy and anticancer treatment was discussed in case she develops stage IV disease.  These options include immune therapy versus oral targeted therapy for BRAF but her tumor is mutated.  After discussion she is agreeable to continue with active surveillance with annual imaging studies to complete 5 years.  Anticipate her last scan will be in January 2022.  2.    Dermatology surveillance: I recommended continued dermatology follow-up at this time.  She remains up-to-date.  3.  Follow-up: In 6 months for repeat evaluation after imaging studies.  15  minutes was spent with the patient face-to-face today.  More than 50% of time was spent on reviewing her disease status, treatment options and complications related to therapy.      Lindsey Button, MD 8/4/20209:28 AM

## 2019-06-16 NOTE — Telephone Encounter (Signed)
Scheduled per sch msg. Mailed printout.  °

## 2019-07-16 ENCOUNTER — Encounter (INDEPENDENT_AMBULATORY_CARE_PROVIDER_SITE_OTHER): Payer: No Typology Code available for payment source | Admitting: Ophthalmology

## 2019-07-24 ENCOUNTER — Other Ambulatory Visit: Payer: Self-pay

## 2019-07-24 ENCOUNTER — Encounter (INDEPENDENT_AMBULATORY_CARE_PROVIDER_SITE_OTHER): Payer: No Typology Code available for payment source | Admitting: Ophthalmology

## 2019-07-24 DIAGNOSIS — H43813 Vitreous degeneration, bilateral: Secondary | ICD-10-CM

## 2019-07-24 DIAGNOSIS — D3131 Benign neoplasm of right choroid: Secondary | ICD-10-CM

## 2019-07-24 DIAGNOSIS — D3132 Benign neoplasm of left choroid: Secondary | ICD-10-CM

## 2019-08-12 MED FILL — RIZATRIPTAN 10 MG ODT: 10 | 30 days supply | Qty: 12 | Fill #2

## 2019-08-24 MED FILL — RIZATRIPTAN 10 MG ODT: 10 | 30 days supply | Qty: 12 | Fill #2

## 2019-11-28 MED FILL — RIZATRIPTAN 10 MG ODT: 10 | 30 days supply | Qty: 12 | Fill #3

## 2019-12-11 ENCOUNTER — Other Ambulatory Visit: Payer: Self-pay

## 2019-12-11 ENCOUNTER — Inpatient Hospital Stay: Payer: No Typology Code available for payment source | Attending: Oncology

## 2019-12-11 DIAGNOSIS — Z85820 Personal history of malignant melanoma of skin: Secondary | ICD-10-CM | POA: Diagnosis present

## 2019-12-11 DIAGNOSIS — C4359 Malignant melanoma of other part of trunk: Secondary | ICD-10-CM

## 2019-12-11 LAB — CMP (CANCER CENTER ONLY)
ALT: 10 U/L (ref 0–44)
AST: 12 U/L — ABNORMAL LOW (ref 15–41)
Albumin: 4 g/dL (ref 3.5–5.0)
Alkaline Phosphatase: 66 U/L (ref 38–126)
Anion gap: 7 (ref 5–15)
BUN: 17 mg/dL (ref 6–20)
CO2: 27 mmol/L (ref 22–32)
Calcium: 8.7 mg/dL — ABNORMAL LOW (ref 8.9–10.3)
Chloride: 106 mmol/L (ref 98–111)
Creatinine: 0.88 mg/dL (ref 0.44–1.00)
GFR, Est AFR Am: >60 mL/min (ref >60)
GFR, Estimated: >60 mL/min (ref >60)
Glucose, Bld: 82 mg/dL (ref 70–99)
Potassium: 3.9 mmol/L (ref 3.5–5.1)
Sodium: 140 mmol/L (ref 135–145)
Total Bilirubin: 0.5 mg/dL (ref 0.3–1.2)
Total Protein: 6.9 g/dL (ref 6.5–8.1)

## 2019-12-11 LAB — CBC WITH DIFFERENTIAL (CANCER CENTER ONLY)
Abs Immature Granulocytes: 0.02 10*3/uL (ref 0.00–0.07)
Basophils Absolute: 0 10*3/uL (ref 0.0–0.1)
Basophils Relative: 0 %
Eosinophils Absolute: 0.1 10*3/uL (ref 0.0–0.5)
Eosinophils Relative: 2 %
HCT: 40.5 % (ref 36.0–46.0)
Hemoglobin: 13.7 g/dL (ref 12.0–15.0)
Immature Granulocytes: 0 %
Lymphocytes Relative: 23 %
Lymphs Abs: 1.2 10*3/uL (ref 0.7–4.0)
MCH: 31.1 pg (ref 26.0–34.0)
MCHC: 33.8 g/dL (ref 30.0–36.0)
MCV: 92 fL (ref 80.0–100.0)
Monocytes Absolute: 0.4 10*3/uL (ref 0.1–1.0)
Monocytes Relative: 8 %
Neutro Abs: 3.4 10*3/uL (ref 1.7–7.7)
Neutrophils Relative %: 67 %
Platelet Count: 229 10*3/uL (ref 150–400)
RBC: 4.4 MIL/uL (ref 3.87–5.11)
RDW: 12.9 % (ref 11.5–15.5)
WBC Count: 5 10*3/uL (ref 4.0–10.5)
nRBC: 0 % (ref 0.0–0.2)

## 2019-12-11 LAB — LACTATE DEHYDROGENASE: LDH: 171 U/L (ref 98–192)

## 2019-12-17 ENCOUNTER — Encounter (HOSPITAL_COMMUNITY)
Admission: RE | Admit: 2019-12-17 | Discharge: 2019-12-17 | Disposition: A | Discharge status: Home / Self Care | Payer: No Typology Code available for payment source | Source: Ambulatory Visit | Attending: Oncology | Admitting: Oncology

## 2019-12-17 ENCOUNTER — Other Ambulatory Visit: Payer: Self-pay

## 2019-12-17 DIAGNOSIS — C4359 Malignant melanoma of other part of trunk: Secondary | ICD-10-CM | POA: Insufficient documentation

## 2019-12-17 LAB — GLUCOSE, CAPILLARY: Glucose-Capillary: 85 mg/dL (ref 70–99)

## 2019-12-17 MED ORDER — FLUDEOXYGLUCOSE F - 18 (FDG) INJECTION
8.1000 | Freq: Once | INTRAVENOUS | Status: AC
  Administered 2019-12-17: 14:00:00 8.1 via INTRAVENOUS

## 2019-12-18 ENCOUNTER — Inpatient Hospital Stay: Payer: No Typology Code available for payment source | Attending: Oncology | Admitting: Oncology

## 2019-12-18 ENCOUNTER — Other Ambulatory Visit: Payer: Self-pay

## 2019-12-18 ENCOUNTER — Telehealth: Payer: Self-pay | Admitting: Oncology

## 2019-12-18 VITALS — BP 119/82 | HR 66 | Temp 98.0°F | Resp 20 | Ht 72.0 in | Wt 171.0 lb

## 2019-12-18 DIAGNOSIS — Z85820 Personal history of malignant melanoma of skin: Secondary | ICD-10-CM | POA: Diagnosis present

## 2019-12-18 DIAGNOSIS — R21 Rash and other nonspecific skin eruption: Secondary | ICD-10-CM | POA: Insufficient documentation

## 2019-12-18 DIAGNOSIS — Z9221 Personal history of antineoplastic chemotherapy: Secondary | ICD-10-CM | POA: Insufficient documentation

## 2019-12-18 DIAGNOSIS — Z79899 Other long term (current) drug therapy: Secondary | ICD-10-CM | POA: Insufficient documentation

## 2019-12-18 DIAGNOSIS — C4359 Malignant melanoma of other part of trunk: Secondary | ICD-10-CM | POA: Diagnosis not present

## 2019-12-18 NOTE — Telephone Encounter (Signed)
Scheduled appt per 2/5 los.  Sent a message to HIM pool to get a calendar mailed out. 

## 2019-12-18 NOTE — Progress Notes (Signed)
Hematology and Oncology Follow Up Visit  Aubrei Dun [id number] 28-Jul-1981 39 y.o. 12/18/2019 10:48 AM Polite, [name], MDPolite, [name], MD   Principle Diagnosis: 39 year old woman with cutaneous melanoma of the upper back diagnosed in 2016.  She was found to have T2N1 disease.   Prior Therapy: She underwent a wide excision and a left axillary lymph node dissection on October 25, 2015 after a positive left axillary sentinel lymph node biopsy.  Her final pathology revealed a stage IIIa with a T2 N1 pathology.     Current therapy: Active surveillance.  Interim History: Ms. Schmelter returns today for a follow-up visit.  Since the last visit, she reports no major changes in her health.  She has reported a rash on her back despite topical steroids has not noticed any improvements.  He continues to be active and attends activities of daily living.  She denies any constitutional symptoms or recent hospitalizations.          Medications: No changes on review. Current Outpatient Medications  Medication Sig Dispense Refill  . benzonatate (TESSALON) 100 MG capsule Take 1-2 capsules (100-200 mg total) by mouth 3 (three) times daily as needed for cough. 20 capsule 0  . fluticasone (FLONASE) 50 MCG/ACT nasal spray Place 2 sprays into both nostrils daily. 16 g 0  . rizatriptan (MAXALT-MLT) 10 MG disintegrating tablet   0   No current facility-administered medications for this visit.     Allergies: No Known Allergies       Physical Exam:  Blood pressure 119/82, pulse 66, temperature 98 F (36.7 C), temperature source Temporal, resp. rate 20, height 6' (1.829 m), weight 171 lb (77.6 kg), last menstrual period 12/14/2019, SpO2 98 %.    ECOG: 0    General appearance: Alert, awake without any distress. Head: Atraumatic without abnormalities Oropharynx: Without any thrush or ulcers. Eyes: No scleral icterus. Lymph nodes: No lymphadenopathy noted in the cervical, supraclavicular, or  axillary nodes Heart:regular rate and rhythm, without any murmurs or gallops.   Lung: Clear to auscultation without any rhonchi, wheezes or dullness to percussion. Abdomin: Soft, nontender without any shifting dullness or ascites. Musculoskeletal: No clubbing or cyanosis. Neurological: No motor or sensory deficits. Skin: Erythema noted on the left upper back.  The redness is flat without any pustules or plaques.          Lab Results: Lab Results  Component Value Date   WBC 5.0 12/11/2019   HGB 13.7 12/11/2019   HCT 40.5 12/11/2019   MCV 92.0 12/11/2019   PLT 229 12/11/2019     Chemistry      Component Value Date/Time   NA 140 12/11/2019 0920   K 3.9 12/11/2019 0920   CL 106 12/11/2019 0920   CO2 27 12/11/2019 0920   BUN 17 12/11/2019 0920   CREATININE 0.88 12/11/2019 0920      Component Value Date/Time   CALCIUM 8.7 (L) 12/11/2019 0920   ALKPHOS 66 12/11/2019 0920   AST 12 (L) 12/11/2019 0920   ALT 10 12/11/2019 0920   BILITOT 0.5 12/11/2019 0920      IMPRESSION: 1. No signs of disease recurrence in the chest, abdomen or pelvis. 2. Low level activity associated with right thyroid nodule. Thyroid ultrasound may be helpful. 3. Persistent left adnexal lesion, potentially cyst in though at slightly higher range of density and persisting on multiple prior studies, consider pelvic sonography for further assessment if not performed. 4. Endometrial uptake which is diffuse, difference in uptake likely related to  variability associated with different stages of the menstrual cycle. Attention on pelvic sonography.   Impression and Plan:  39 year old woman with:  1.    Cutaneous melanoma of the upper back diagnosed in 2016.  She was found to have T2N1 disease.  He remains on active surveillance at this time without any evidence of relapse.,  Obtained on December 17, 2019 was personally reviewed which showed no evidence of metastatic disease at this time.  The natural  course of this disease was reviewed as well as risk of relapse was assessed.  At this time I recommended continued active surveillance with repeat imaging studies in 1 year.  2.    Dermatology surveillance: She has follow-up in 1 week to continue dermatology surveillance.  3.  Adnexal lesion: I recommended follow-up with gynecology which she is already established.   4.  Follow-up: In 6 months for repeat evaluation after imaging studies.  30  minutes was spent on this encounter.  The time was dedicated to reviewing imaging studies, disease status update as well as treatment options for the future.    Zola Button, MD 2/5/202110:48 AM

## 2019-12-22 MED FILL — RIZATRIPTAN 10 MG ODT: 10 | 30 days supply | Qty: 12 | Fill #4

## 2019-12-24 ENCOUNTER — Telehealth: Payer: Self-pay

## 2019-12-24 MED FILL — CLOBETASOL PROPIONATE 0.05: 0.05 | 30 days supply | Qty: 60 | Fill #0

## 2019-12-24 NOTE — Telephone Encounter (Signed)
Received call from the patient requesting that the most recent PET scan results be faxed to her OBGYN Dr. Aloha Gell. PET scan faxed to [phone] attn: Dr. Pamala Hurry. Received confirmation of receipt.

## 2020-01-22 MED FILL — RIZATRIPTAN 10 MG ODT: 10 | 30 days supply | Qty: 12 | Fill #0

## 2020-02-02 ENCOUNTER — Encounter: Payer: Self-pay | Admitting: Physician Assistant

## 2020-02-02 ENCOUNTER — Ambulatory Visit (INDEPENDENT_AMBULATORY_CARE_PROVIDER_SITE_OTHER): Payer: No Typology Code available for payment source | Admitting: Physician Assistant

## 2020-02-02 DIAGNOSIS — L570 Actinic keratosis: Secondary | ICD-10-CM

## 2020-02-02 DIAGNOSIS — L300 Nummular dermatitis: Secondary | ICD-10-CM | POA: Diagnosis not present

## 2020-02-02 NOTE — Progress Notes (Addendum)
   Follow-Up Visit   Subjective  Lindsey Palmer is a 39 y.o. female who presents for the following: Follow-up (ln2 left ear re check it) and Eczema (on back last visit treatment given clobetasol 0.05% qd seems to help).   Location: left helix ear Duration: 1 month  Quality: smooth Associated Signs/Symptoms:itches Modifying Factors: LN2 Severity: marked improvement   The following portions of the chart were reviewed this encounter and updated as appropriate: Tobacco  Allergies  Meds  Problems  Med Hx  Surg Hx  Fam Hx      Objective  Well appearing patient in no apparent distress; mood and affect are within normal limits.  A full examination was performed including scalp, head, eyes, ears, nose, lips, neck, chest, axillae, abdomen, back, buttocks, bilateral upper extremities, bilateral lower extremities, hands, feet, fingers, toes, fingernails, and toenails. All findings within normal limits unless otherwise noted below. No atypical nevi noted at the time of the visit.  Objective  Left Superior Helix: Once frozen- pink smooth patch- smooth  Objective  Mid Back: Ill-defined pink papules/plaques with scale-crust.   Assessment & Plan  Actinic keratosis Left Superior Helix  observe  Nummular eczema Mid Back  Continue OTC medications

## 2020-03-11 MED FILL — RIZATRIPTAN 10 MG ODT: 10 | 30 days supply | Qty: 12 | Fill #1

## 2020-03-31 NOTE — Addendum Note (Signed)
Addended by: Robyne Askew R on: 03/31/2020 01:35 PM   Modules accepted: Level of Service

## 2020-04-05 ENCOUNTER — Other Ambulatory Visit: Payer: Self-pay

## 2020-04-05 ENCOUNTER — Inpatient Hospital Stay: Payer: No Typology Code available for payment source | Attending: Oncology | Admitting: Oncology

## 2020-04-05 VITALS — BP 122/85 | HR 70 | Temp 98.1°F | Resp 18 | Ht 72.0 in | Wt 175.7 lb

## 2020-04-05 DIAGNOSIS — R220 Localized swelling, mass and lump, head: Secondary | ICD-10-CM | POA: Insufficient documentation

## 2020-04-05 DIAGNOSIS — Z923 Personal history of irradiation: Secondary | ICD-10-CM | POA: Diagnosis not present

## 2020-04-05 DIAGNOSIS — C4359 Malignant melanoma of other part of trunk: Secondary | ICD-10-CM | POA: Diagnosis not present

## 2020-04-05 DIAGNOSIS — Z85820 Personal history of malignant melanoma of skin: Secondary | ICD-10-CM | POA: Insufficient documentation

## 2020-04-05 NOTE — Progress Notes (Signed)
Hematology and Oncology Follow Up Visit  Orlando Ansara [id number] 11-04-1981 39 y.o. 04/05/2020 2:59 PM Polite, [name], MDPolite, [name], MD   Principle Diagnosis: 39 year old woman with T2N1 cutaneous melanoma diagnosed in 2016.  She was found to have melanoma of the upper back at that time.   Prior Therapy: She underwent a wide excision and a left axillary lymph node dissection on October 25, 2015 after a positive left axillary sentinel lymph node biopsy.  Her final pathology revealed a stage IIIa with a T2 N1 pathology.   [facility] did not receive adjuvant therapy.   Current therapy: Active surveillance.  Interim History: Ms. Crosthwaite presents today for repeat evaluation based on her request.  Since the last visit, she noticed a lump on her right temple scalp in the last 24 hours.  He denies any trauma or any injury to that area.  She denies any skin rash or lesions.  She denies any irritation or tenderness.          Medications: Updated on review. Current Outpatient Medications  Medication Sig Dispense Refill  . rizatriptan (MAXALT-MLT) 10 MG disintegrating tablet   0   No current facility-administered medications for this visit.     Allergies:  Allergies  Allergen Reactions  . Chlorhexidine Rash         Physical Exam:     ECOG: 0      General appearance: Comfortable appearing without any discomfort Head: Normocephalic without any trauma Oropharynx: Mucous membranes are moist and pink without any thrush or ulcers. Eyes: Pupils are equal and round reactive to light. Lymph nodes: No cervical, supraclavicular, inguinal or axillary lymphadenopathy.   Heart:regular rate and rhythm.  S1 and S2 without leg edema. Lung: Clear without any rhonchi or wheezes.  No dullness to percussion. Abdomin: Soft, nontender, nondistended with good bowel sounds.  No hepatosplenomegaly. Musculoskeletal: No joint deformity or effusion.  Full range of motion  noted. Neurological: No deficits noted on motor, sensory and deep tendon reflex exam. Skin: Slightly raised lesion on the left temporal scalp without any ulceration or any pigmented lesions.  No other skin rashes or lesions.            Lab Results: Lab Results  Component Value Date   WBC 5.0 12/11/2019   HGB 13.7 12/11/2019   HCT 40.5 12/11/2019   MCV 92.0 12/11/2019   PLT 229 12/11/2019     Chemistry      Component Value Date/Time   NA 140 12/11/2019 0920   K 3.9 12/11/2019 0920   CL 106 12/11/2019 0920   CO2 27 12/11/2019 0920   BUN 17 12/11/2019 0920   CREATININE 0.88 12/11/2019 0920      Component Value Date/Time   CALCIUM 8.7 (L) 12/11/2019 0920   ALKPHOS 66 12/11/2019 0920   AST 12 (L) 12/11/2019 0920   ALT 10 12/11/2019 0920   BILITOT 0.5 12/11/2019 0920      IMPRESSION: 1. No signs of disease recurrence in the chest, abdomen or pelvis. 2. Low level activity associated with right thyroid nodule. Thyroid ultrasound may be helpful. 3. Persistent left adnexal lesion, potentially cyst in though at slightly higher range of density and persisting on multiple prior studies, consider pelvic sonography for further assessment if not performed. 4. Endometrial uptake which is diffuse, difference in uptake likely related to variability associated with different stages of the menstrual cycle. Attention on pelvic sonography.   Impression and Plan:  39 year old woman with:  1.  T2N1 cutaneous melanoma of the upper back diagnosed in 2016.  She is status post surgical resection including lymph node dissection without adjuvant therapy completed in 2016.  The natural course of her disease was updated and that imaging studies from February 2021 were personally reviewed.  Risk of relapse at this time remains low given her remote surgery and recent imaging studies.  I do not think her scalp lesion is consistent with relapsed disease.  2.    Left temporal scalp  protrusion: No pigmented lesion noted at this time.  This is likely consistent with a subcutaneous cyst.  I recommended that follow-up with dermatology especially if it is growing in the near future for sampling.  Although a do not think of metastatic melanoma is likely, a new melanoma could possibly be detected that way.  3.  Follow-up: Follow-up in 3 months already scheduled.  40 minutes were spent on this visit.  Time was dedicated to reviewing imaging studies, discussing differential diagnosis and recommendation for the future.    Zola Button, MD 5/25/20212:59 PM

## 2020-04-26 ENCOUNTER — Ambulatory Visit (INDEPENDENT_AMBULATORY_CARE_PROVIDER_SITE_OTHER): Payer: No Typology Code available for payment source | Admitting: Physician Assistant

## 2020-04-26 ENCOUNTER — Encounter: Payer: Self-pay | Admitting: Physician Assistant

## 2020-04-26 ENCOUNTER — Other Ambulatory Visit: Payer: Self-pay

## 2020-04-26 DIAGNOSIS — L739 Follicular disorder, unspecified: Secondary | ICD-10-CM | POA: Diagnosis not present

## 2020-04-26 MED FILL — RIZATRIPTAN 10 MG ODT: 10 | 30 days supply | Qty: 12 | Fill #2

## 2020-04-26 NOTE — Progress Notes (Signed)
   Follow-Up Visit   Subjective  Lindsey Palmer is a 39 y.o. female who presents for the following: Skin Problem (dry itch skin on legs triamcinoloe every other day). Patient has PCOS.  The following portions of the chart were reviewed this encounter and updated as appropriate:     Objective  Well appearing patient in no apparent distress; mood and affect are within normal limits.  Legs were examined. Follicular erythema and ingrown hairs. Few pink xerotic  Patches.   Assessment & Plan  Folliculitis both legs  Discussed laser hair removal. She will discuss spironolactone with her gynecologist.

## 2020-05-05 ENCOUNTER — Encounter: Payer: Self-pay | Admitting: Podiatry

## 2020-05-05 ENCOUNTER — Other Ambulatory Visit: Payer: Self-pay

## 2020-05-05 ENCOUNTER — Ambulatory Visit (INDEPENDENT_AMBULATORY_CARE_PROVIDER_SITE_OTHER): Payer: No Typology Code available for payment source | Admitting: Podiatry

## 2020-05-05 ENCOUNTER — Ambulatory Visit (INDEPENDENT_AMBULATORY_CARE_PROVIDER_SITE_OTHER): Payer: No Typology Code available for payment source

## 2020-05-05 DIAGNOSIS — T8484XA Pain due to internal orthopedic prosthetic devices, implants and grafts, initial encounter: Secondary | ICD-10-CM | POA: Diagnosis not present

## 2020-05-05 DIAGNOSIS — Q828 Other specified congenital malformations of skin: Secondary | ICD-10-CM | POA: Diagnosis not present

## 2020-05-05 NOTE — Patient Instructions (Signed)

## 2020-05-08 NOTE — Progress Notes (Signed)
Subjective:  Patient ID: Lindsey Palmer, female    DOB: 07-May-1981,  MRN: [id number] HPI Chief Complaint  Patient presents with  . Foot Pain    Sub 5th MPJ left - callused area, she had "ankle surgery" 10 years ago and started to form shortly afterwards, she's always had problems with foot since surgery, feels achy, feels unstable, unable to be active and very limiting to daily life  . New Patient (Initial Visit)    39 y.o. female presents with the above complaint.   ROS: Denies fever chills nausea vomiting muscle aches pains calf pain back pain chest pain shortness of breath.  Past Medical History:  Diagnosis Date  . Abnormal Pap smear   . Atypical nevus 09/22/2010   mild mid back Dr Allyson Sabal  . Atypical nevus 09/22/2010   mild/moderate left back sup Dr Allyson Sabal  . Atypical nevus 09/22/2010   moderate left back inf Dr Allyson Sabal  . Atypical nevus 09/08/2015   mod/severe right lateral thigh Dr Allyson Sabal  . Atypical nevus 01/27/2016   severe mid upper back tx/ w/s  . Atypical nevus 01/27/2016   severe mid left back tx w/s x2  . Atypical nevus 07/15/2018   moderate right buttock  . Atypical nevus 07/15/2018   mild left hamstring  . Atypical nevus 07/15/2018   moderate left upper abdomen  . H/O varicella   . Headache(784.0)   . Melanoma ([place])    stage3  . MM (malignant melanoma of skin) ([place]) 09/08/2015   MM anatomic level 4 left upper back tx Dr Allyson Sabal  . Urinary complications    Past Surgical History:  Procedure Laterality Date  . ANKLE SURGERY    . DILATION AND CURETTAGE OF UTERUS    . lymphnode disection Left     Current Outpatient Medications:  .  rizatriptan (MAXALT-MLT) 10 MG disintegrating tablet, , Disp: , Rfl: 0  Allergies  Allergen Reactions  . Chlorhexidine Rash  . Other Rash    Severe rash    Review of Systems Objective:  There were no vitals filed for this visit.  General: Well developed, nourished, in no acute distress, alert and oriented x3    Dermatological: Skin is warm, dry and supple bilateral. Nails x 10 are well maintained; remaining integument appears unremarkable at this time. There are no open sores, no preulcerative lesions, no rash or signs of infection present.  Vascular: Dorsalis Pedis artery and Posterior Tibial artery pedal pulses are 2/4 bilateral with immedate capillary fill time. Pedal hair growth present. No varicosities and no lower extremity edema present bilateral.   Neruologic: Grossly intact via light touch bilateral. Vibratory intact via tuning fork bilateral. Protective threshold with Semmes Wienstein monofilament intact to all pedal sites bilateral. Patellar and Achilles deep tendon reflexes 2+ bilateral. No Babinski or clonus noted bilateral.   Musculoskeletal: No gross boney pedal deformities bilateral. No pain, crepitus, or limitation noted with foot and ankle range of motion bilateral. Muscular strength 5/5 in all groups tested bilateral.  Varus deformity of the foot secondary to calcaneal slide osteotomy. Gait: Unassisted, Nonantalgic.    Radiographs:  Radiographs taken today demonstrate an osseously mature individual internal fixation to the heel and the navicular is intact.  Assessment & Plan:   Assessment: Porokeratotic lesion subfifth metatarsal with bursitis left cannot rule out wart.  The foot appears to be in slight varus.  Plan: Excision of the lesion today curettage sent for pathologic evaluation this was performed after local anesthetic was administered she tolerated procedure  well without complications.  I also injected the area for bursitis today.     Izsak Meir T. [place], [place]

## 2020-05-09 ENCOUNTER — Telehealth: Payer: Self-pay | Admitting: Podiatry

## 2020-05-09 NOTE — Telephone Encounter (Signed)
Lovena Le from [employer] [facility] called stated that they received a skin sample but the req is marked for nail testing

## 2020-05-11 NOTE — Telephone Encounter (Signed)
[facility]KO form faxed over for skin path, not fungal culture

## 2020-05-19 ENCOUNTER — Encounter: Payer: Self-pay | Admitting: Podiatry

## 2020-05-19 ENCOUNTER — Other Ambulatory Visit: Payer: Self-pay

## 2020-05-19 ENCOUNTER — Ambulatory Visit (INDEPENDENT_AMBULATORY_CARE_PROVIDER_SITE_OTHER): Payer: No Typology Code available for payment source | Admitting: Podiatry

## 2020-05-19 DIAGNOSIS — B070 Plantar wart: Secondary | ICD-10-CM

## 2020-05-19 NOTE — Progress Notes (Signed)
She presents today states that she is doing much better status post curettage plantar lesion subfifth met head left due to her surgically induced varus deformity.  Objective: Vital signs are stable alert oriented x3 wound to the plantar aspect of the foot appears to be healing very nicely epithelialization is occurring there is no signs of infection.  Slightly macerated secondary to ointments.  Pathology result does demonstrate pressure induced involuted verruca vulgaris.  Assessment: Verruca vulgaris possibly associated with excess pressure due to forefoot varus from previous rear foot surgery.  Plan: Discussed etiology pathology conservative surgical therapies discussed possibly performing a plantar condylectomy as opposed to 1/5 met transpositional osteotomy.  Wear monitor weight to see if this grows back at all.  Should she have questions or concerns she will call me.

## 2020-06-16 ENCOUNTER — Inpatient Hospital Stay: Payer: No Typology Code available for payment source

## 2020-06-16 ENCOUNTER — Inpatient Hospital Stay: Payer: No Typology Code available for payment source | Admitting: Oncology

## 2020-06-20 MED FILL — RIZATRIPTAN 10 MG ODT: 10 | 30 days supply | Qty: 12 | Fill #3

## 2020-06-23 ENCOUNTER — Ambulatory Visit: Payer: No Typology Code available for payment source | Admitting: Physician Assistant

## 2020-07-25 ENCOUNTER — Encounter (INDEPENDENT_AMBULATORY_CARE_PROVIDER_SITE_OTHER): Payer: No Typology Code available for payment source | Admitting: Ophthalmology

## 2020-07-30 MED FILL — RIZATRIPTAN 10 MG ODT: 10 | 30 days supply | Qty: 12 | Fill #4

## 2020-09-12 IMAGING — PT NM PET IMAGE RESTAGE (PS) WHOLE BODY
7 series · 25 of 25 positions shown · non-contrast
Comparison: 11/14/2017

CLINICAL DATA: Subsequent treatment strategy for melanoma, left
upper back.

EXAM:
NUCLEAR MEDICINE PET WHOLE BODY
TECHNIQUE: 8.3 mCi F-18 FDG was injected intravenously. Full-ring PET imaging
was performed from the skull base to thigh after the radiotracer. CT
data was obtained and used for attenuation correction and anatomic
localization.
Fasting blood glucose: 78 mg/dl

[Series 3: pet wb ac · axial · 5.0mm · 4.07mm/px · z∈[-641,+1283]mm · 5 of 482 slices shown]
[im 1/482]
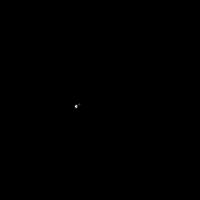
[im 121/482]
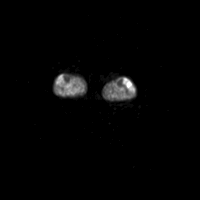
[im 241/482]
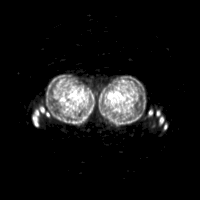
[im 361/482]
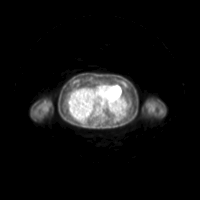
[im 482/482]
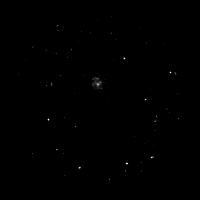

[Series 4: ct wb 5.0 hd_fov · axial · 5.0mm · 1.07mm/px · z∈[-645,+1283]mm · 6 of 483 slices shown]
[im 1/483]
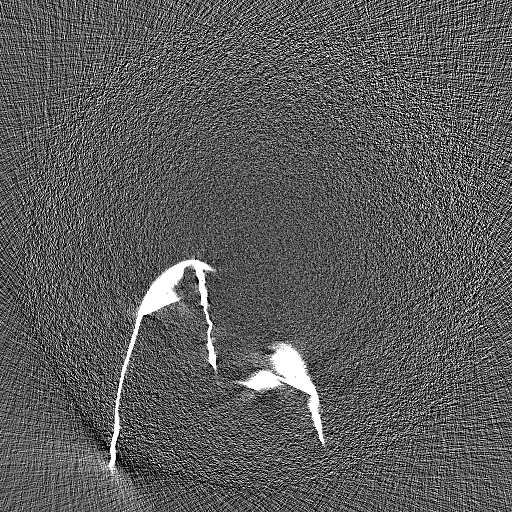
[im 97/483  soft-tissue]
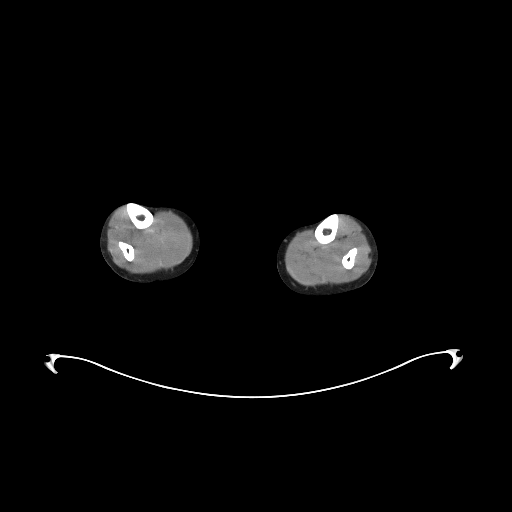
[im 193/483  soft-tissue]
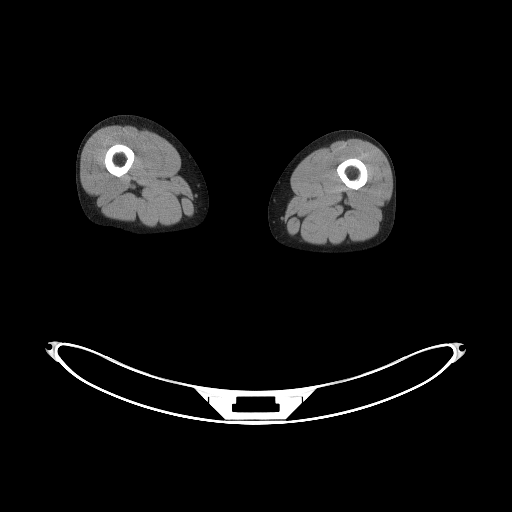
[im 290/483  soft-tissue]
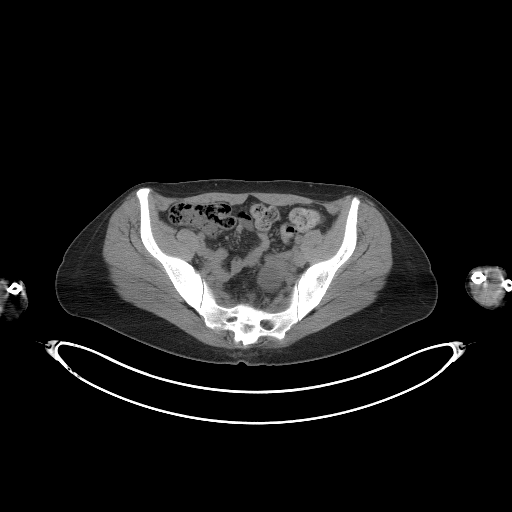
[im 386/483  soft-tissue]
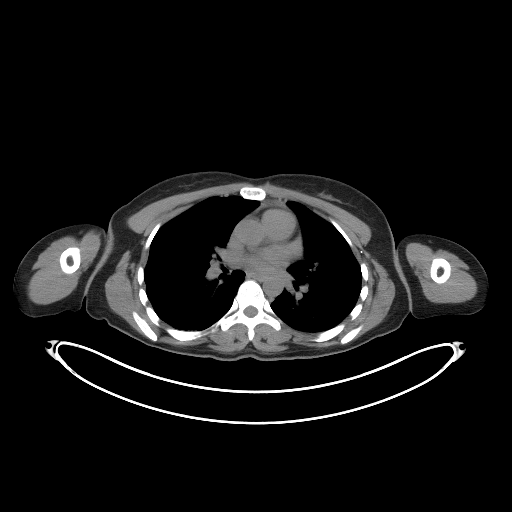
[im 483/483  soft-tissue]
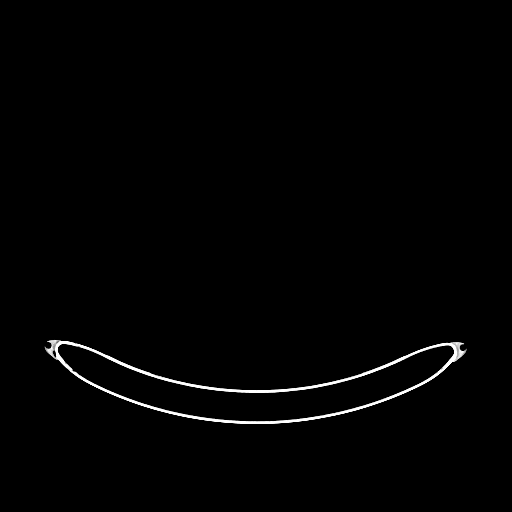

[Series 5: pet wb nac · axial · 5.0mm · 4.07mm/px · z∈[-645,+1283]mm · 6 of 483 slices shown]
[im 1/483]
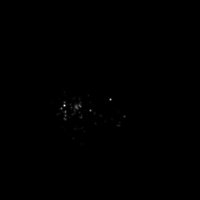
[im 97/483]
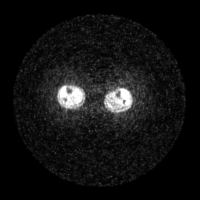
[im 193/483]
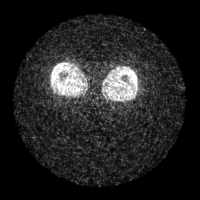
[im 290/483]
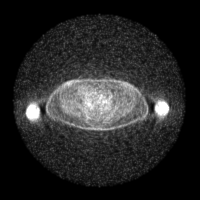
[im 386/483]
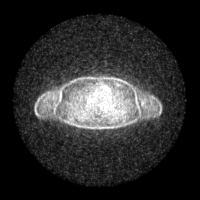
[im 483/483]
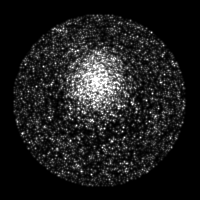

[Series 8: ct wb 5.0 b70f (id)_bone · axial · 5.0mm · 0.61mm/px · 1 of 76 slices shown]
[im 1/76  soft-tissue]
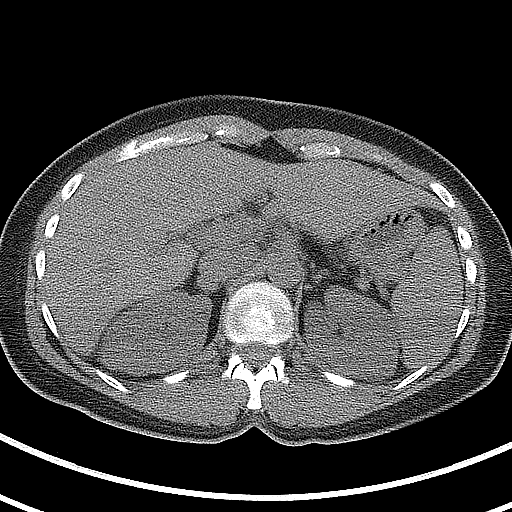

[Series 603: range-ct wb 5.0 hd_fov-cor-<alpha range> · 1 of 72 slices shown]
[im 1/72]
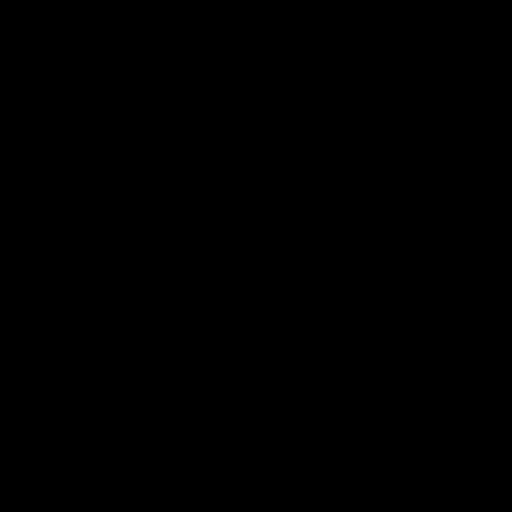

[Series 604: mip range · coronal · 3.99mm/px · 1 of 32 slices shown]
[im 1/32]
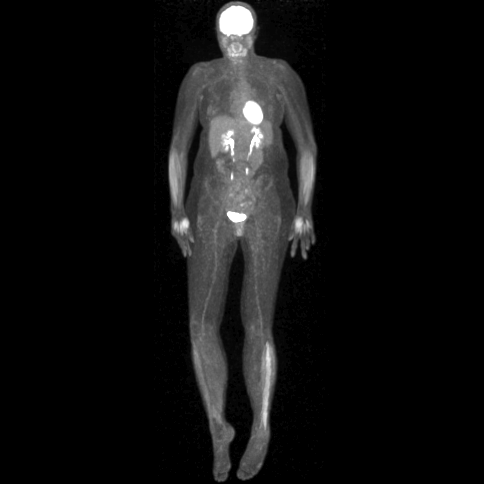

[Series 605: range-ct wb 5.0 hd_fov-tra-<alpha range> · 5 of 432 slices shown]
[im 1/432]
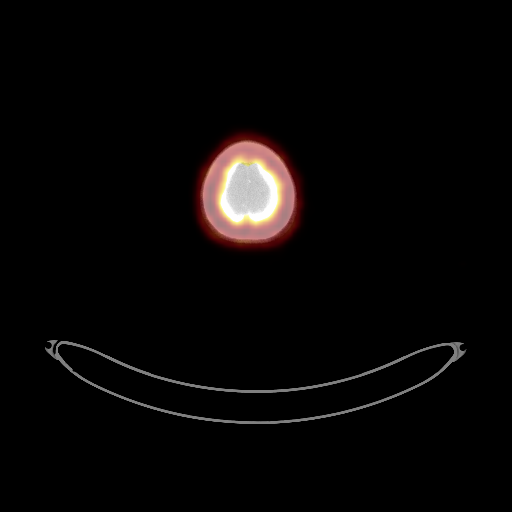
[im 108/432]
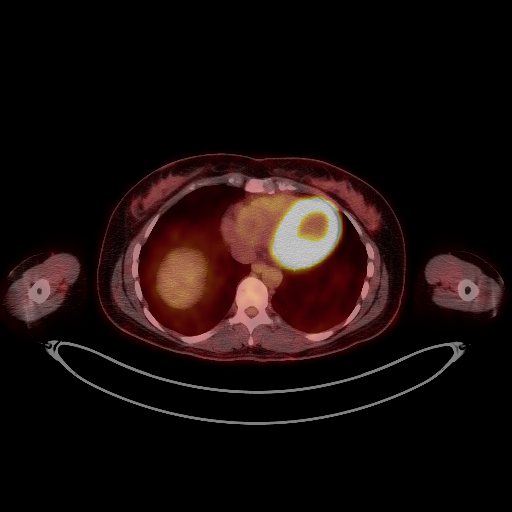
[im 216/432]
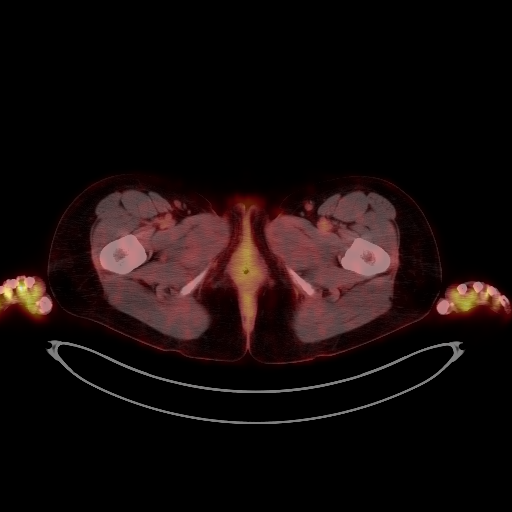
[im 324/432]
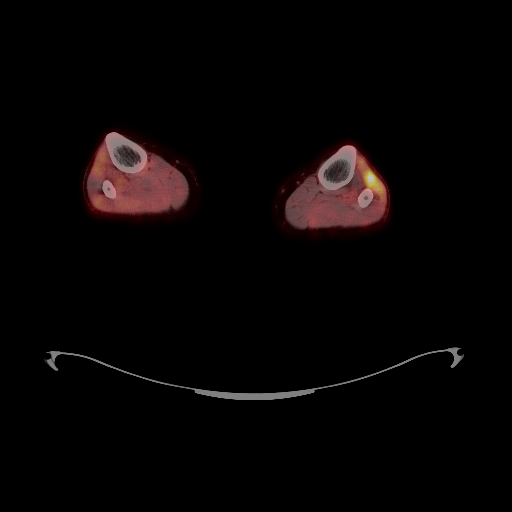
[im 432/432]
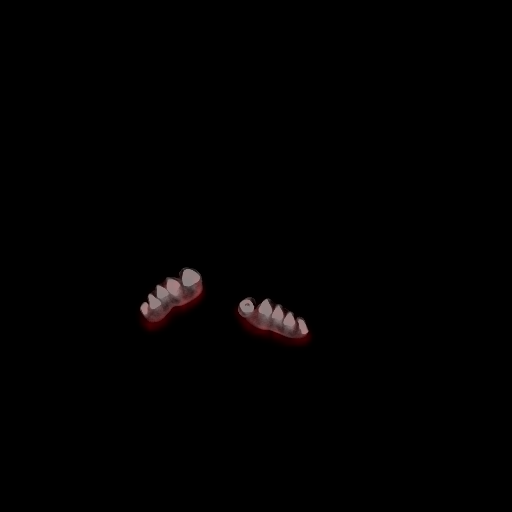

[25 of 25 positions shown; findings below may reference images not displayed]

FINDINGS: Mediastinal blood pool activity: SUV max

HEAD/NECK: No significant abnormal hypermetabolic activity in this
region.

Incidental CT findings: none

CHEST: No significant abnormal hypermetabolic activity in this
region.

Incidental CT findings: Prior left axillary dissection.

ABDOMEN/PELVIS: Accentuated activity along the endometrium is
thought to be physiologic. Bilobed cystic lesion of the left ovary
with some complexity, but no accentuated abnormal activity.

Incidental CT findings: none

SKELETON: No significant abnormal hypermetabolic activity in this
region.

Incidental CT findings: Postoperative findings in the left ankle.

EXTREMITIES: Physiologic activity in the anterior compartment of the
left lower lobe. Physiologic activity in both forearms.

Incidental CT findings: none
IMPRESSION: 1. No current findings of active malignancy.
2. Bilobed cystic lesion left ovary with some complexity, but no
accentuated abnormal activity. This could be further characterized
and surveilled by pelvic ultrasound if clinically warranted.

## 2020-09-22 ENCOUNTER — Other Ambulatory Visit (HOSPITAL_COMMUNITY): Payer: Self-pay | Admitting: Internal Medicine

## 2020-09-22 MED FILL — RIZATRIPTAN 10 MG ODT: 10 | 20 days supply | Qty: 12 | Fill #0

## 2020-10-22 MED FILL — RIZATRIPTAN 10 MG ODT: 10 | 20 days supply | Qty: 12 | Fill #1

## 2020-11-29 ENCOUNTER — Ambulatory Visit: Payer: No Typology Code available for payment source | Admitting: Physician Assistant

## 2021-01-11 ENCOUNTER — Other Ambulatory Visit: Payer: Self-pay | Admitting: Oncology

## 2021-01-11 ENCOUNTER — Telehealth: Payer: Self-pay

## 2021-01-11 DIAGNOSIS — C4359 Malignant melanoma of other part of trunk: Secondary | ICD-10-CM

## 2021-01-11 NOTE — Telephone Encounter (Signed)
-----   Message from Wyatt Portela, MD sent at 01/11/2021  2:33 PM EST ----- Will order PET and follow up after that. I will send message to scheduling. Thanks ----- Message ----- From: Tami Lin, RN Sent: 01/11/2021   2:32 PM EST To: Wyatt Portela, MD  Patient called and was inquiring about a yearly PET Scan. She canceled her last appointment in August and did not reschedule. She wants to know if she can have PET ordered and then follow up with you or if you need to see her first.  [name]

## 2021-01-11 NOTE — Telephone Encounter (Signed)
Called patient and made her aware to expect a call from [facility] to schedule PET scan and [facility] Scheduling department to schedule follow up with Dr. Alen Blew. Patient verbalized understanding.

## 2021-01-24 ENCOUNTER — Ambulatory Visit (HOSPITAL_COMMUNITY)
Admission: RE | Admit: 2021-01-24 | Discharge: 2021-01-24 | Disposition: A | Discharge status: Home / Self Care | Payer: 59 | Source: Ambulatory Visit | Attending: Oncology | Admitting: Oncology

## 2021-01-24 ENCOUNTER — Other Ambulatory Visit: Payer: Self-pay

## 2021-01-24 ENCOUNTER — Inpatient Hospital Stay: Payer: 59 | Attending: Oncology

## 2021-01-24 DIAGNOSIS — C439 Malignant melanoma of skin, unspecified: Secondary | ICD-10-CM | POA: Diagnosis not present

## 2021-01-24 DIAGNOSIS — C4359 Malignant melanoma of other part of trunk: Secondary | ICD-10-CM | POA: Insufficient documentation

## 2021-01-24 LAB — CMP (CANCER CENTER ONLY)
ALT: 10 U/L (ref 0–44)
AST: 12 U/L — ABNORMAL LOW (ref 15–41)
Albumin: 4.2 g/dL (ref 3.5–5.0)
Alkaline Phosphatase: 61 U/L (ref 38–126)
Anion gap: 7 (ref 5–15)
BUN: 17 mg/dL (ref 6–20)
CO2: 27 mmol/L (ref 22–32)
Calcium: 8.9 mg/dL (ref 8.9–10.3)
Chloride: 105 mmol/L (ref 98–111)
Creatinine: 0.89 mg/dL (ref 0.44–1.00)
GFR, Estimated: >60 mL/min (ref >60)
Glucose, Bld: 106 mg/dL — ABNORMAL HIGH (ref 70–99)
Potassium: 4.4 mmol/L (ref 3.5–5.1)
Sodium: 139 mmol/L (ref 135–145)
Total Bilirubin: 0.7 mg/dL (ref 0.3–1.2)
Total Protein: 7 g/dL (ref 6.5–8.1)

## 2021-01-24 LAB — CBC WITH DIFFERENTIAL (CANCER CENTER ONLY)
Abs Immature Granulocytes: 0.03 10*3/uL (ref 0.00–0.07)
Basophils Absolute: 0 10*3/uL (ref 0.0–0.1)
Basophils Relative: 0 %
Eosinophils Absolute: 0.1 10*3/uL (ref 0.0–0.5)
Eosinophils Relative: 2 %
HCT: 39.6 % (ref 36.0–46.0)
Hemoglobin: 13.4 g/dL (ref 12.0–15.0)
Immature Granulocytes: 1 %
Lymphocytes Relative: 25 %
Lymphs Abs: 1.5 10*3/uL (ref 0.7–4.0)
MCH: 30.6 pg (ref 26.0–34.0)
MCHC: 33.8 g/dL (ref 30.0–36.0)
MCV: 90.4 fL (ref 80.0–100.0)
Monocytes Absolute: 0.4 10*3/uL (ref 0.1–1.0)
Monocytes Relative: 7 %
Neutro Abs: 4 10*3/uL (ref 1.7–7.7)
Neutrophils Relative %: 65 %
Platelet Count: 221 10*3/uL (ref 150–400)
RBC: 4.38 MIL/uL (ref 3.87–5.11)
RDW: 13.3 % (ref 11.5–15.5)
WBC Count: 6 10*3/uL (ref 4.0–10.5)
nRBC: 0 % (ref 0.0–0.2)

## 2021-01-24 LAB — GLUCOSE, CAPILLARY: Glucose-Capillary: 87 mg/dL (ref 70–99)

## 2021-01-24 LAB — LACTATE DEHYDROGENASE: LDH: 140 U/L (ref 98–192)

## 2021-01-24 MED ORDER — FLUDEOXYGLUCOSE F - 18 (FDG) INJECTION
9.1000 | Freq: Once | INTRAVENOUS | Status: AC | PRN
  Administered 2021-01-24: 9.1 via INTRAVENOUS

## 2021-01-31 ENCOUNTER — Inpatient Hospital Stay: Payer: 59 | Admitting: Oncology

## 2021-01-31 ENCOUNTER — Other Ambulatory Visit: Payer: Self-pay

## 2021-01-31 VITALS — BP 116/72 | HR 60 | Temp 98.1°F | Resp 18 | Ht 72.0 in | Wt 169.1 lb

## 2021-01-31 DIAGNOSIS — C4359 Malignant melanoma of other part of trunk: Secondary | ICD-10-CM | POA: Diagnosis not present

## 2021-01-31 NOTE — Progress Notes (Signed)
Hematology and Oncology Follow Up Visit  Sadhana Frater [id number] 14-Apr-1981 40 y.o. 01/31/2021 3:33 PM Polite, [name], MDPolite, [name], MD   Principle Diagnosis: 40 year old woman with stage III melanoma of the upper back diagnosed in 2016.  He was found to have T2N1 disease at that time.  Prior Therapy: She underwent a wide excision and a left axillary lymph node dissection on October 25, 2015 after a positive left axillary sentinel lymph node biopsy.  Her final pathology revealed a stage IIIa with a T2 N1 pathology.   [facility] did not receive adjuvant therapy.   Current therapy: Active surveillance.  Interim History: Ms. Kotz is here for follow-up evaluation.  Since the last visit, she reports feeling well without any major complaints.  She denies any nausea, vomiting or abdominal pain.  She denies skin rashes or lesions.  She continues to be active and attends activities of daily living.          Medications: Reviewed without changes. Current Outpatient Medications  Medication Sig Dispense Refill  . rizatriptan (MAXALT-MLT) 10 MG disintegrating tablet   0   No current facility-administered medications for this visit.     Allergies:  Allergies  Allergen Reactions  . Chlorhexidine Rash  . Other Rash    Severe rash          Physical Exam:  Blood pressure 116/72, pulse 60, temperature 98.1 F (36.7 C), temperature source Tympanic, resp. rate 18, height 6' (1.829 m), weight 169 lb 2 oz (76.7 kg), last menstrual period 01/03/2021, SpO2 99 %.     ECOG: 0    General appearance: Alert, awake without any distress. Head: Atraumatic without abnormalities Oropharynx: Without any thrush or ulcers. Eyes: No scleral icterus. Lymph nodes: No lymphadenopathy noted in the cervical, supraclavicular, or axillary nodes Heart:regular rate and rhythm, without any murmurs or gallops.   Lung: Clear to auscultation without any rhonchi, wheezes or dullness to  percussion. Abdomin: Soft, nontender without any shifting dullness or ascites. Musculoskeletal: No clubbing or cyanosis. Neurological: No motor or sensory deficits. Skin: No rashes or lesions.              Lab Results: Lab Results  Component Value Date   WBC 6.0 01/24/2021   HGB 13.4 01/24/2021   HCT 39.6 01/24/2021   MCV 90.4 01/24/2021   PLT 221 01/24/2021     Chemistry      Component Value Date/Time   NA 139 01/24/2021 1013   K 4.4 01/24/2021 1013   CL 105 01/24/2021 1013   CO2 27 01/24/2021 1013   BUN 17 01/24/2021 1013   CREATININE 0.89 01/24/2021 1013      Component Value Date/Time   CALCIUM 8.9 01/24/2021 1013   ALKPHOS 61 01/24/2021 1013   AST 12 (L) 01/24/2021 1013   ALT 10 01/24/2021 1013   BILITOT 0.7 01/24/2021 1013       IMPRESSION: 1. No evidence of melanoma recurrence on whole-body FDG PET scan. 2. No suspicious finding on CT portion exam.    Impression and Plan:  40 year old woman with:  1.    Stage III utaneous melanoma of the upper back diagnosed in 2016.  She was found to have T2N1 disease after surgical resection without any evidence of metastatic disease and did not receive any adjuvant treatment.  Disease status was updated at this time.  Treatment options were discussed.  PET CT scan obtained on January 24, 2021 was discussed today and reviewed.  She does not have any  evidence of metastatic disease at this time and the risk of relapse remains low.  I do not recommend any additional treatment or any further repeat imaging studies.  I do recommend continue dermatology surveillance at this time.  I feel that the risk of cancer recurrence is minimal versus developing new melanoma.  At this time, she is agreeable to this plan.  2.    Dermatology surveillance: She continues to follow with dermatology and encouraged her to do so.   3.  Follow-up: Probable I am happy to see her in the future as needed.  30  minutes were dedicated to  this encounter.  The time was spent on reviewing imaging studies, laboratory data review and future plan of care discussion.    Zola Button, MD 3/22/20223:33 PM

## 2021-02-02 ENCOUNTER — Other Ambulatory Visit (HOSPITAL_BASED_OUTPATIENT_CLINIC_OR_DEPARTMENT_OTHER): Payer: Self-pay

## 2021-03-12 MED FILL — Rizatriptan Benzoate Oral Disintegrating Tab 10 MG (Base Eq): ORAL | 30 days supply | Qty: 12 | Fill #0 | Status: AC

## 2021-03-13 ENCOUNTER — Other Ambulatory Visit (HOSPITAL_COMMUNITY): Payer: Self-pay

## 2021-03-15 ENCOUNTER — Other Ambulatory Visit (HOSPITAL_COMMUNITY): Payer: Self-pay

## 2021-04-16 MED FILL — Rizatriptan Benzoate Oral Disintegrating Tab 10 MG (Base Eq): ORAL | 30 days supply | Qty: 12 | Fill #1 | Status: AC

## 2021-04-17 ENCOUNTER — Other Ambulatory Visit (HOSPITAL_COMMUNITY): Payer: Self-pay

## 2021-05-21 MED FILL — Rizatriptan Benzoate Oral Disintegrating Tab 10 MG (Base Eq): ORAL | 30 days supply | Qty: 12 | Fill #2 | Status: AC

## 2021-05-22 ENCOUNTER — Other Ambulatory Visit (HOSPITAL_COMMUNITY): Payer: Self-pay

## 2021-07-11 ENCOUNTER — Other Ambulatory Visit (HOSPITAL_COMMUNITY): Payer: Self-pay

## 2021-07-12 ENCOUNTER — Other Ambulatory Visit (HOSPITAL_COMMUNITY): Payer: Self-pay

## 2021-07-12 MED ORDER — RIZATRIPTAN BENZOATE 10 MG PO TBDP
ORAL_TABLET | ORAL | 2 refills | Status: DC
  Filled 2021-07-12: qty 12, 30d supply, fill #0
  Filled 2021-09-01: qty 12, 30d supply, fill #1
  Filled 2021-10-14: qty 12, 30d supply, fill #2

## 2021-07-18 ENCOUNTER — Other Ambulatory Visit (HOSPITAL_COMMUNITY): Payer: Self-pay

## 2021-09-02 ENCOUNTER — Other Ambulatory Visit (HOSPITAL_COMMUNITY): Payer: Self-pay

## 2021-09-08 DIAGNOSIS — M25569 Pain in unspecified knee: Secondary | ICD-10-CM | POA: Diagnosis not present

## 2021-09-08 DIAGNOSIS — G43909 Migraine, unspecified, not intractable, without status migrainosus: Secondary | ICD-10-CM | POA: Diagnosis not present

## 2021-09-08 DIAGNOSIS — Z0000 Encounter for general adult medical examination without abnormal findings: Secondary | ICD-10-CM | POA: Diagnosis not present

## 2021-09-08 DIAGNOSIS — E282 Polycystic ovarian syndrome: Secondary | ICD-10-CM | POA: Diagnosis not present

## 2021-09-08 DIAGNOSIS — Z13220 Encounter for screening for lipoid disorders: Secondary | ICD-10-CM | POA: Diagnosis not present

## 2021-09-12 ENCOUNTER — Ambulatory Visit: Payer: 59 | Admitting: Podiatry

## 2021-09-18 DIAGNOSIS — M25562 Pain in left knee: Secondary | ICD-10-CM | POA: Insufficient documentation

## 2021-09-18 DIAGNOSIS — M25561 Pain in right knee: Secondary | ICD-10-CM | POA: Diagnosis not present

## 2021-09-28 ENCOUNTER — Other Ambulatory Visit: Payer: Self-pay

## 2021-09-28 ENCOUNTER — Ambulatory Visit: Payer: 59 | Admitting: Podiatry

## 2021-09-28 ENCOUNTER — Ambulatory Visit: Payer: 59

## 2021-09-28 ENCOUNTER — Encounter: Payer: Self-pay | Admitting: Podiatry

## 2021-09-28 DIAGNOSIS — T8484XA Pain due to internal orthopedic prosthetic devices, implants and grafts, initial encounter: Secondary | ICD-10-CM | POA: Diagnosis not present

## 2021-10-01 NOTE — Progress Notes (Signed)
She presents today to discuss surgical intervention regarding her fifth metatarsal of her left foot.  She states that is been getting worse over the past several months and would like to consider surgical intervention.  Objective: Vital signs are stable alert oriented x3 pulses are palpable.  She still retains pain on palpation to the fifth metatarsal head and as a result does demonstrate reactive hyperkeratotic tissue to the plantar aspect of the head.  Assessment tailor's bunion deformity with painful callus plantarly.  Plan: We had previously discussed the possible plantar condylectomy.  However it appears that the tailor's bunion deformity is starting to worsen.  So I feel that an osteotomy would probably be better especially if we can lift the osteotomy back just a little bit.  Plan: Consented her today for fifth metatarsal osteotomy with screw fixation.  We did discuss the pros and cons of the surgery with possible consequences and side effects.  She understands and is amendable to it.  We did state that we can possibly see postop pain bleeding swelling infection recurrence need for further surgery overcorrection under correction loss of digit loss of limb loss of life.  Provided her with information regarding the surgery center and anesthesia group.  Provided her with a cam walker as well as information to contact the scheduler.

## 2021-10-03 ENCOUNTER — Telehealth: Payer: Self-pay | Admitting: Urology

## 2021-10-03 NOTE — Telephone Encounter (Signed)
DOS - 10/27/21  METATARSAL OSTEOTOMY 5TH LEFT --- 96222  [facility] EFFECTIVE DATE - 11/12/20   PLAN DEDUCTIBLE - $300 W/ $0.00 REMAINING OUT OF POCKET - $7900.00  W/ $5,922.73 REMAINING COINSURANCE - 40% COPAY - $0.00  SPOKE WITH KEASIA WITH [facility] AND SHE STATED THAT CPT CODE 97989 NO PRIOR AUTH IS REQUIRED.  REF # [id number]

## 2021-10-14 ENCOUNTER — Other Ambulatory Visit (HOSPITAL_COMMUNITY): Payer: Self-pay

## 2021-10-17 ENCOUNTER — Other Ambulatory Visit (HOSPITAL_COMMUNITY): Payer: Self-pay

## 2021-10-17 DIAGNOSIS — Z23 Encounter for immunization: Secondary | ICD-10-CM | POA: Diagnosis not present

## 2021-10-17 DIAGNOSIS — L309 Dermatitis, unspecified: Secondary | ICD-10-CM | POA: Diagnosis not present

## 2021-10-17 MED ORDER — CLOBETASOL PROPIONATE 0.05 % EX OINT
TOPICAL_OINTMENT | CUTANEOUS | 0 refills | Status: DC
  Filled 2021-10-17: qty 60, 14d supply, fill #0

## 2021-10-18 ENCOUNTER — Other Ambulatory Visit (HOSPITAL_COMMUNITY): Payer: Self-pay

## 2021-10-19 ENCOUNTER — Ambulatory Visit: Payer: 59 | Admitting: Podiatry

## 2021-10-19 ENCOUNTER — Telehealth: Payer: Self-pay

## 2021-10-19 NOTE — Telephone Encounter (Signed)
Lindsey Palmer called to reschedule her surgery with Dr. Milinda Pointer on 10/27/2021. She stated her father is having surgery tomorrow and her parents are her back up with taking care of her kids. She is reschedule to 12/22/2021. Notified Dr. Milinda Pointer and Caren Griffins with [facility]

## 2021-10-30 DIAGNOSIS — L309 Dermatitis, unspecified: Secondary | ICD-10-CM | POA: Diagnosis not present

## 2021-10-30 DIAGNOSIS — Z23 Encounter for immunization: Secondary | ICD-10-CM | POA: Diagnosis not present

## 2021-11-01 ENCOUNTER — Other Ambulatory Visit (HOSPITAL_COMMUNITY): Payer: Self-pay

## 2021-11-02 ENCOUNTER — Encounter: Payer: 59 | Admitting: Podiatry

## 2021-11-09 ENCOUNTER — Encounter: Payer: 59 | Admitting: Podiatry

## 2021-11-14 ENCOUNTER — Ambulatory Visit: Payer: 59 | Admitting: Podiatry

## 2021-11-21 ENCOUNTER — Ambulatory Visit: Payer: 59 | Admitting: Podiatry

## 2021-11-21 ENCOUNTER — Encounter: Payer: Self-pay | Admitting: Podiatry

## 2021-11-21 ENCOUNTER — Other Ambulatory Visit: Payer: Self-pay

## 2021-11-21 DIAGNOSIS — M21622 Bunionette of left foot: Secondary | ICD-10-CM

## 2021-11-21 DIAGNOSIS — L309 Dermatitis, unspecified: Secondary | ICD-10-CM | POA: Insufficient documentation

## 2021-11-21 NOTE — Progress Notes (Signed)
She presents today to discuss her upcoming surgery fifth metatarsal left foot.  She states that she think she would like to have a condylectomy rather than the fifth metatarsal osteotomy with the screw fixation.  Objective: Vital signs are stable she alert orient x3 physical exam is the same I reviewed her radiographs.  Assessment tailor's bunion deformity with a plantar reactive hyperkeratotic lesion  Plan: At this point consented her today for a plantar condylectomy of the fifth metatarsal to help alleviate a painful lesion to the plantar aspect of the forefoot left.  We did discuss the possibility that this may not work 100% she understands that and is amenable to it.

## 2021-11-23 ENCOUNTER — Encounter: Payer: 59 | Admitting: Podiatry

## 2021-11-26 ENCOUNTER — Other Ambulatory Visit (HOSPITAL_COMMUNITY): Payer: Self-pay

## 2021-11-28 ENCOUNTER — Other Ambulatory Visit (HOSPITAL_COMMUNITY): Payer: Self-pay

## 2021-11-28 MED ORDER — RIZATRIPTAN BENZOATE 10 MG PO TBDP
ORAL_TABLET | ORAL | 2 refills | Status: DC
  Filled 2021-11-28: qty 12, 30d supply, fill #0
  Filled 2022-01-17: qty 12, 30d supply, fill #1
  Filled 2022-02-27: qty 12, 30d supply, fill #2

## 2021-12-05 ENCOUNTER — Encounter: Payer: 59 | Admitting: Podiatry

## 2021-12-20 ENCOUNTER — Encounter: Payer: Self-pay | Admitting: Physician Assistant

## 2021-12-20 ENCOUNTER — Ambulatory Visit: Payer: 59 | Admitting: Physician Assistant

## 2021-12-20 ENCOUNTER — Other Ambulatory Visit: Payer: Self-pay

## 2021-12-20 DIAGNOSIS — Z85820 Personal history of malignant melanoma of skin: Secondary | ICD-10-CM | POA: Diagnosis not present

## 2021-12-20 DIAGNOSIS — Z86018 Personal history of other benign neoplasm: Secondary | ICD-10-CM | POA: Diagnosis not present

## 2021-12-20 DIAGNOSIS — D225 Melanocytic nevi of trunk: Secondary | ICD-10-CM

## 2021-12-20 DIAGNOSIS — Z1283 Encounter for screening for malignant neoplasm of skin: Secondary | ICD-10-CM | POA: Diagnosis not present

## 2021-12-20 DIAGNOSIS — D229 Melanocytic nevi, unspecified: Secondary | ICD-10-CM

## 2021-12-20 DIAGNOSIS — D485 Neoplasm of uncertain behavior of skin: Secondary | ICD-10-CM

## 2021-12-20 HISTORY — DX: Melanocytic nevi, unspecified: D22.9

## 2021-12-20 NOTE — Patient Instructions (Signed)

## 2021-12-25 ENCOUNTER — Telehealth: Payer: Self-pay | Admitting: Physician Assistant

## 2021-12-25 NOTE — Telephone Encounter (Signed)
Patient is calling for pathology results from last appointment with [facility], PA-C.

## 2021-12-25 NOTE — Telephone Encounter (Signed)
Phone call to patient to let her know that her path is not back yet.

## 2021-12-27 ENCOUNTER — Telehealth: Payer: Self-pay | Admitting: *Deleted

## 2021-12-27 ENCOUNTER — Telehealth: Payer: Self-pay

## 2021-12-27 NOTE — Telephone Encounter (Signed)
Left message for patient to return our phone call for pathology results.

## 2021-12-27 NOTE — Telephone Encounter (Signed)
-----   Message from Warren Danes, [place] sent at 12/26/2021  4:35 PM EST ----- Clear margins.

## 2021-12-27 NOTE — Telephone Encounter (Signed)
Phone call from patient returning our call for her pathology results. Path to patient.

## 2021-12-28 ENCOUNTER — Encounter: Payer: 59 | Admitting: Podiatry

## 2022-01-04 ENCOUNTER — Encounter: Payer: 59 | Admitting: Podiatry

## 2022-01-14 ENCOUNTER — Encounter: Payer: Self-pay | Admitting: Physician Assistant

## 2022-01-14 NOTE — Progress Notes (Signed)
° °  Follow-Up Visit   Subjective  Lindsey Palmer is a 41 y.o. female who presents for the following: Annual Exam (Patient has h/o of atypia and mm 2016 left upper back. No new concern per patient).   The following portions of the chart were reviewed this encounter and updated as appropriate:  Tobacco   Allergies   Meds   Problems   Med Hx   Surg Hx   Fam Hx       Objective  Well appearing patient in no apparent distress; mood and affect are within normal limits.  A full examination was performed including scalp, head, eyes, ears, nose, lips, neck, chest, axillae, abdomen, back, buttocks, bilateral upper extremities, bilateral lower extremities, hands, feet, fingers, toes, fingernails, and toenails. All findings within normal limits unless otherwise noted below.  Left Upper Back Dyspigmented scar clear. DR Allyson Sabal 2016  Left mid Back Smudgy, bichromic macule      Assessment & Plan  Personal history of malignant melanoma of skin Left Upper Back  Yearly skin exams  Neoplasm of uncertain behavior of skin Left mid Back  Skin / nail biopsy Type of biopsy: tangential   Informed consent: discussed and consent obtained   Timeout: patient name, date of birth, surgical site, and procedure verified   Procedure prep:  Patient was prepped and draped in usual sterile fashion (Non sterile) Prep type:  Chlorhexidine Anesthesia: the lesion was anesthetized in a standard fashion   Anesthetic:  1% lidocaine w/ epinephrine 1-100,000 local infiltration Instrument used: flexible razor blade   Outcome: patient tolerated procedure well   Post-procedure details: wound care instructions given    Specimen 1 - Surgical pathology Differential Diagnosis: atypia  Check Margins: yes    I, Amrom Ore, PA-C, have reviewed all documentation's for this visit.  The documentation on 01/14/22 for the exam, diagnosis, procedures and orders are all accurate and complete.

## 2022-01-17 ENCOUNTER — Other Ambulatory Visit (HOSPITAL_COMMUNITY): Payer: Self-pay

## 2022-01-18 ENCOUNTER — Encounter: Payer: 59 | Admitting: Podiatry

## 2022-02-01 ENCOUNTER — Encounter: Payer: 59 | Admitting: Podiatry

## 2022-02-02 ENCOUNTER — Telehealth: Payer: Self-pay | Admitting: Urology

## 2022-02-02 NOTE — Telephone Encounter (Signed)
DOS - 03/02/22 ? ?PLANTAR CONDYLECTOMY 5TH MET LEFT --- 42627 ? ?[facility] EFFECTIVE DATE - 11/12/21 ? ?PLAN DEDUCTIBLE - $350.00 W/ $204.48 REMAINING ?OUT OF POCKET - $7900.00 W/ $0,048.49 REMAINING ?COINSURANCE - 40% ?COPAY - $0.00 ? ? ?SPOKE WITH MILLY WITH [facility] AND SHE STATED THAT FOR CPT CODE 86516 NO PRIOR AUTH IS REQUIRED.  ? ?REF # [id number] ?

## 2022-02-27 ENCOUNTER — Other Ambulatory Visit (HOSPITAL_COMMUNITY): Payer: Self-pay

## 2022-02-28 ENCOUNTER — Telehealth: Payer: Self-pay

## 2022-02-28 NOTE — Telephone Encounter (Signed)
Lindsey Palmer called to cancel her surgery with Dr. Milinda Pointer on 03/02/2022. She stated her son is having some health issues and will need to have surgery next week. She will call us back to reschedule once he is good. Notified Dr. Milinda Pointer and Caren Griffins with [facility] ?

## 2022-03-06 ENCOUNTER — Other Ambulatory Visit (HOSPITAL_COMMUNITY): Payer: Self-pay

## 2022-03-08 ENCOUNTER — Encounter: Payer: 59 | Admitting: Podiatry

## 2022-03-15 ENCOUNTER — Encounter: Payer: 59 | Admitting: Podiatry

## 2022-03-29 ENCOUNTER — Encounter: Payer: 59 | Admitting: Podiatry

## 2022-04-12 ENCOUNTER — Encounter: Payer: 59 | Admitting: Podiatry

## 2022-04-15 ENCOUNTER — Other Ambulatory Visit (HOSPITAL_COMMUNITY): Payer: Self-pay

## 2022-04-16 ENCOUNTER — Other Ambulatory Visit (HOSPITAL_COMMUNITY): Payer: Self-pay

## 2022-04-16 MED ORDER — CLOBETASOL PROPIONATE 0.05 % EX OINT
TOPICAL_OINTMENT | CUTANEOUS | 0 refills | Status: DC
  Filled 2022-04-16: qty 60, 14d supply, fill #0

## 2022-04-16 MED ORDER — RIZATRIPTAN BENZOATE 10 MG PO TBDP
ORAL_TABLET | ORAL | 2 refills | Status: DC
  Filled 2022-04-16: qty 12, 30d supply, fill #0
  Filled 2022-05-18: qty 12, 30d supply, fill #1
  Filled 2022-06-18: qty 12, 30d supply, fill #2

## 2022-05-18 ENCOUNTER — Other Ambulatory Visit (HOSPITAL_COMMUNITY): Payer: Self-pay

## 2022-06-19 ENCOUNTER — Other Ambulatory Visit (HOSPITAL_COMMUNITY): Payer: Self-pay

## 2022-09-07 ENCOUNTER — Other Ambulatory Visit (HOSPITAL_COMMUNITY): Payer: Self-pay

## 2022-09-10 ENCOUNTER — Other Ambulatory Visit (HOSPITAL_COMMUNITY): Payer: Self-pay

## 2022-09-10 MED ORDER — RIZATRIPTAN BENZOATE 10 MG PO TBDP
10.0000 mg | ORAL_TABLET | Freq: Every day | ORAL | 2 refills | Status: DC
  Filled 2022-09-10: qty 12, 30d supply, fill #0
  Filled 2022-12-06: qty 12, 30d supply, fill #1
  Filled 2023-01-12: qty 12, 30d supply, fill #2

## 2022-12-07 ENCOUNTER — Other Ambulatory Visit: Payer: Self-pay

## 2022-12-10 DIAGNOSIS — E7800 Pure hypercholesterolemia, unspecified: Secondary | ICD-10-CM | POA: Diagnosis not present

## 2022-12-10 DIAGNOSIS — Z0000 Encounter for general adult medical examination without abnormal findings: Secondary | ICD-10-CM | POA: Diagnosis not present

## 2022-12-10 DIAGNOSIS — Z13220 Encounter for screening for lipoid disorders: Secondary | ICD-10-CM | POA: Diagnosis not present

## 2022-12-10 DIAGNOSIS — Z85820 Personal history of malignant melanoma of skin: Secondary | ICD-10-CM | POA: Diagnosis not present

## 2022-12-10 DIAGNOSIS — E282 Polycystic ovarian syndrome: Secondary | ICD-10-CM | POA: Diagnosis not present

## 2022-12-13 ENCOUNTER — Other Ambulatory Visit (HOSPITAL_COMMUNITY): Payer: Self-pay

## 2022-12-13 MED ORDER — ROSUVASTATIN CALCIUM 10 MG PO TABS
10.0000 mg | ORAL_TABLET | Freq: Every day | ORAL | 11 refills | Status: DC
  Filled 2022-12-13 – 2022-12-18 (×2): qty 30, 30d supply, fill #0

## 2022-12-18 ENCOUNTER — Other Ambulatory Visit (HOSPITAL_COMMUNITY): Payer: Self-pay

## 2022-12-20 ENCOUNTER — Ambulatory Visit: Payer: 59 | Admitting: Physician Assistant

## 2022-12-27 ENCOUNTER — Other Ambulatory Visit (HOSPITAL_BASED_OUTPATIENT_CLINIC_OR_DEPARTMENT_OTHER): Payer: Self-pay | Admitting: Internal Medicine

## 2022-12-27 DIAGNOSIS — E7800 Pure hypercholesterolemia, unspecified: Secondary | ICD-10-CM

## 2023-01-12 ENCOUNTER — Other Ambulatory Visit (HOSPITAL_COMMUNITY): Payer: Self-pay

## 2023-01-22 ENCOUNTER — Ambulatory Visit (HOSPITAL_BASED_OUTPATIENT_CLINIC_OR_DEPARTMENT_OTHER)
Admission: RE | Admit: 2023-01-22 | Discharge: 2023-01-22 | Disposition: A | Discharge status: Home / Self Care | Payer: Commercial Managed Care - PPO | Source: Ambulatory Visit | Attending: Internal Medicine | Admitting: Internal Medicine

## 2023-01-22 DIAGNOSIS — E7800 Pure hypercholesterolemia, unspecified: Secondary | ICD-10-CM

## 2023-02-12 ENCOUNTER — Ambulatory Visit (HOSPITAL_BASED_OUTPATIENT_CLINIC_OR_DEPARTMENT_OTHER): Payer: Commercial Managed Care - PPO

## 2023-02-28 ENCOUNTER — Other Ambulatory Visit (HOSPITAL_COMMUNITY): Payer: Self-pay

## 2023-03-02 ENCOUNTER — Other Ambulatory Visit (HOSPITAL_COMMUNITY): Payer: Self-pay

## 2023-03-02 MED ORDER — RIZATRIPTAN BENZOATE 10 MG PO TBDP
ORAL_TABLET | ORAL | 2 refills | Status: DC
  Filled 2023-03-02: qty 12, 30d supply, fill #0

## 2023-04-09 DIAGNOSIS — H5203 Hypermetropia, bilateral: Secondary | ICD-10-CM | POA: Diagnosis not present

## 2023-04-09 DIAGNOSIS — H52222 Regular astigmatism, left eye: Secondary | ICD-10-CM | POA: Diagnosis not present

## 2023-04-09 DIAGNOSIS — D3121 Benign neoplasm of right retina: Secondary | ICD-10-CM | POA: Diagnosis not present

## 2023-05-10 NOTE — Progress Notes (Signed)
 Lindsey Palmer is a 42 y.o. female (DOB 06/14/1981) in for Annual Exam and Headache   Before the patient was roomed, a team huddle was undertaken with the nursing staff to identify and discuss care coordination issues. After they were roomed the patient's past medical and surgical history, family and social history, allergies, medications, and problem list were reviewed and updated as appropriate.   SUBJECTIVE:  The patient comes in as a new patient.  Today's office visit is lengthy.  She has multiple questions with protracted discussion. 1.  Annual exam, diet discussed, exercises discussed.  Her OB/GYN handles her mammogram and Pap smear.  She has two boys of age 27 and 65.  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 2.  Migraine equivalent.  We will check her laboratories.  She thinks it is currently stable.  She will call for rizatriptan  refills when necessary.  No new features. 3.  Melanoma of the back, status post excision.  She continues to follow up with her oncologist. No signs of recurrence.  She takes appropriate solar precautions. 4.  Eczema.  She will call for steroid creams as necessary.  She is doing well today. 5.  Vitamin deficiencies and other systemic symptoms:  She has occasional breast tenderness, mostly associated with her periods, but she has changed her diet.  We will check her iodine level.  She has taken vitamin D supplementation in the past for prior deficiency.  We will assess her level.  She has a remote history of anemia, for which we will check her iron .   Other review of systems, see adult history questionnaire in multiple pages initialed by me on 05/10/2023 and incorporated herein by reference.   OBJECTIVE: GENERAL:  White female in no acute distress.   VITAL SIGNS:  As noted, who appears healthy.  She prefers not to have a nurse for exam.   HEENT:  Tympanic membranes benign.  HEENT benign.   NECK:  Supple, no JVD, adenopathy, thyromegaly,  or bruit.   CHEST:  Clear.  No wheeze or rhonchi.   HEART:  Regular rate and rhythm.  No murmur, rub, or gallop.   BACK:  With a well-healed surgical excision site of her melanoma.   ABDOMEN:  Positive bowel sounds, soft, nontender, nondistended.  No guarding, mass, or rebound.   MUSCULOSKELETAL:  There is a slight increase of the hip rib curvature on the right as compared to the left, suggestive of possible slight scoliosis.  She is tall and thin.   EXTREMITIES:  Moves all extremities well.  DIP, PIP, MCP, wrist, elbow, shoulders, hips, knees, and ankles have full active and passive range of motion and strength.  Deep tendon reflexes preserved.   GENITAL, RECTAL AND BREAST:  Deferred to OB/GYN.   ADDITIONAL REVIEW OF SYSTEMS:  Shows that she recently self-paid for coronary artery calcium  score of which returned as a zero.  Further diet, exercise, and medication choices are discussed at length.   ASSESSMENT AND PLAN:   1.  Annual exam.  Continue the excellent job with diet and exercise.  She is eating primarily animal products and green leafy vegetables. 2.  Migraine, stable.  Call for refills when necessary. 3.  Melanoma.  Continue follow up elsewhere. Other problems as outlined below with studies as ordered.   Follow up in a year or sooner if any changes or abnormal findings.    PLAN and ORDER Summary: [name] was seen today for annual exam  and headache.  Diagnoses and all orders for this visit:  Annual physical exam -     CMP14+CBC/D/Plt+TSH; Future -     Lipid Panel; Future -     Lipid Panel -     CMP14+CBC/D/Plt+TSH  Migraine equivalent -     Vitamin B12 and Folate; Future -     Vitamin B6; Future -     Magnesium; Future -     Magnesium -     Vitamin B6 -     Vitamin B12 and Folate  Melanoma of back (*)  Intrinsic eczema  Breast tenderness in female -     Iodine, Serum or Plasma; Future -     Iodine, Serum or Plasma  Vitamin D deficiency -     Vitamin D 25  Hydroxy; Future -     Vitamin D 25 Hydroxy  History of anemia -     Ferritin; Future -     Iron  and Total Iron -Binding Capacity (TIBC); Future -     Iron  and Total Iron -Binding Capacity (TIBC) -     Ferritin  Medications: please see other chart areas  Follow Up: At today's office visit the risks, benefits, and alternatives to today's treatment plan were discussed. The patient will Follow up in about 1 year (around 05/09/2024) for fasting office visit, annual physical.  She will follow up sooner for any substantial change in condition.  The patient may call for refills as needed. Non-controlled substances may be refilled until 6 months from today's date.

## 2023-09-19 ENCOUNTER — Other Ambulatory Visit (HOSPITAL_COMMUNITY): Payer: Self-pay

## 2023-09-19 ENCOUNTER — Other Ambulatory Visit: Payer: Self-pay

## 2023-09-19 MED ORDER — ACCRUFER 30 MG PO CAPS
30.0000 mg | ORAL_CAPSULE | Freq: Two times a day (BID) | ORAL | 1 refills | Status: DC
  Filled 2023-09-19: qty 60, 30d supply, fill #0

## 2023-09-19 MED ORDER — TRANEXAMIC ACID 650 MG PO TABS
1300.0000 mg | ORAL_TABLET | Freq: Three times a day (TID) | ORAL | 1 refills | Status: DC
  Filled 2023-09-19 (×3): qty 30, 5d supply, fill #0

## 2023-09-24 ENCOUNTER — Other Ambulatory Visit (HOSPITAL_COMMUNITY): Payer: Self-pay

## 2023-10-08 ENCOUNTER — Other Ambulatory Visit (HOSPITAL_COMMUNITY): Payer: Self-pay

## 2023-10-08 MED ORDER — NORETHINDRONE 0.35 MG PO TABS
1.0000 | ORAL_TABLET | Freq: Every day | ORAL | 11 refills | Status: DC
  Filled 2023-10-08: qty 28, 28d supply, fill #0

## 2023-10-14 ENCOUNTER — Other Ambulatory Visit (HOSPITAL_COMMUNITY): Payer: Self-pay

## 2023-10-15 ENCOUNTER — Other Ambulatory Visit (HOSPITAL_BASED_OUTPATIENT_CLINIC_OR_DEPARTMENT_OTHER): Payer: Self-pay

## 2023-10-15 MED ORDER — NORETHINDRONE ACETATE 5 MG PO TABS
5.0000 mg | ORAL_TABLET | Freq: Every day | ORAL | 11 refills | Status: DC
  Filled 2023-10-15: qty 30, 30d supply, fill #0
  Filled 2023-11-11: qty 30, 30d supply, fill #1
  Filled 2023-12-12 – 2023-12-16 (×2): qty 30, 30d supply, fill #2
  Filled 2024-01-11 – 2024-01-15 (×2): qty 30, 30d supply, fill #3
  Filled 2024-02-10: qty 30, 30d supply, fill #4

## 2023-11-12 ENCOUNTER — Other Ambulatory Visit: Payer: Self-pay

## 2023-12-13 ENCOUNTER — Other Ambulatory Visit: Payer: Self-pay

## 2023-12-13 ENCOUNTER — Other Ambulatory Visit (HOSPITAL_COMMUNITY): Payer: Self-pay

## 2023-12-13 ENCOUNTER — Encounter: Payer: Self-pay | Admitting: Pharmacist

## 2023-12-16 ENCOUNTER — Other Ambulatory Visit (HOSPITAL_COMMUNITY): Payer: Self-pay

## 2023-12-17 ENCOUNTER — Other Ambulatory Visit: Payer: Self-pay

## 2024-01-04 ENCOUNTER — Other Ambulatory Visit (HOSPITAL_COMMUNITY): Payer: Self-pay

## 2024-01-13 ENCOUNTER — Other Ambulatory Visit: Payer: Self-pay

## 2024-01-14 ENCOUNTER — Other Ambulatory Visit: Payer: Self-pay

## 2024-01-15 ENCOUNTER — Other Ambulatory Visit (HOSPITAL_COMMUNITY): Payer: Self-pay

## 2024-01-15 ENCOUNTER — Other Ambulatory Visit: Payer: Self-pay

## 2024-01-24 ENCOUNTER — Other Ambulatory Visit: Payer: Self-pay

## 2024-01-24 ENCOUNTER — Other Ambulatory Visit (HOSPITAL_COMMUNITY): Payer: Self-pay

## 2024-01-24 MED ORDER — CLOBETASOL PROPIONATE 0.05 % EX CREA
1.0000 | TOPICAL_CREAM | Freq: Two times a day (BID) | CUTANEOUS | 0 refills | Status: DC
  Filled 2024-01-24: qty 60, 14d supply, fill #0

## 2024-02-11 ENCOUNTER — Other Ambulatory Visit (HOSPITAL_COMMUNITY): Payer: Self-pay

## 2024-04-14 ENCOUNTER — Ambulatory Visit: Admitting: Obstetrics and Gynecology

## 2024-04-14 ENCOUNTER — Encounter: Payer: Self-pay | Admitting: Obstetrics and Gynecology

## 2024-04-14 VITALS — BP 115/73 | HR 71 | Ht 72.0 in | Wt 179.0 lb

## 2024-04-14 DIAGNOSIS — D369 Benign neoplasm, unspecified site: Secondary | ICD-10-CM | POA: Diagnosis not present

## 2024-04-14 DIAGNOSIS — D259 Leiomyoma of uterus, unspecified: Secondary | ICD-10-CM

## 2024-04-14 DIAGNOSIS — N939 Abnormal uterine and vaginal bleeding, unspecified: Secondary | ICD-10-CM

## 2024-04-14 NOTE — Progress Notes (Signed)
 Pt is new to office, here to establish new Gyn. Pt states cycles are prolonged, last cycle was 15 days and extremely heavy. Pt has been told she may have endometriosis. Pt has had u/s about 6 months ago showing Fibroids and ovarian cyst.  Pt states last pap smear was within last year - can request records. Pt is interested in thermal imaging vs mammogram.

## 2024-04-14 NOTE — Progress Notes (Signed)
 NEW GYNECOLOGY VISIT Chief Complaint  Patient presents with   New Patient (Initial Visit)     Subjective:  Lindsey Palmer is a 43 y.o. [id number] who presents to establish care.  Previous patient at [facility]. Has been struggling with heavy and painful periods. Reports that she was initially put on progesterone only pill, which she didn't realize was a birth control pill, so was then changed to norethindrone . She says this stopped her periods but she gained about 20 lbs and became depressed, which she has never struggled with before.  She has since stopped the medication. Her period returned in May and lasted for 2 weeks.  She reports that she had an ultrasound about 6 months ago. She has fibroids that she states measure about 4 cm. She also has dermoid cysts that she thinks are also about 4 cm.  No records available for my review today.   Gyn History: Patient's last menstrual period was 03/26/2024. Sexually active: yes/no: Yes Contraception: condoms History of STIs: No Last pap: No results found for: "DIAGPAP", "HPV", "HPVHIGH" Reportedly within the last year History of abnormal pap: yes in her teens, states no LEEP/[facility] history, no colposcopies or biopsies, normalized on follow up Periods: as above  OB History     Gravida  4   Para  2   Term  2   Preterm      AB  2   Living  2      SAB  2   IAB      Ectopic      Multiple      Live Births  2           Past Medical History:  Diagnosis Date   Abnormal Pap smear    Anemia    Atypical mole 12/20/2021   Left Mid Back (moderate)   Atypical nevus 09/22/2010   mild mid back Dr Fleurette Humbles   Atypical nevus 09/22/2010   mild/moderate left back sup Dr Fleurette Humbles   Atypical nevus 09/22/2010   moderate left back inf Dr Fleurette Humbles   Atypical nevus 09/08/2015   mod/severe right lateral thigh Dr Fleurette Humbles   Atypical nevus 01/27/2016   severe mid upper back tx/ w/s   Atypical nevus 01/27/2016   severe mid left back tx  w/s x2   Atypical nevus 07/15/2018   moderate right buttock   Atypical nevus 07/15/2018   mild left hamstring   Atypical nevus 07/15/2018   moderate left upper abdomen   H/O varicella    Headache(784.0)    Melanoma (HCC)    stage3   MM (malignant melanoma of skin) (HCC) 09/08/2015   MM anatomic level 4 left upper back tx Dr Fleurette Humbles   Urinary complications     Past Surgical History:  Procedure Laterality Date   ANKLE SURGERY     DILATION AND CURETTAGE OF UTERUS     lymphnode disection Left     Social History   Socioeconomic History   Marital status: Married    Spouse name: Not on file   Number of children: Not on file   Years of education: Not on file   Highest education level: Not on file  Occupational History   Not on file  Tobacco Use   Smoking status: Never   Smokeless tobacco: Never  Vaping Use   Vaping status: Never Used  Substance and Sexual Activity   Alcohol use: Not Currently    Comment: occ   Drug use: No  Sexual activity: Yes    Birth control/protection: Condom  Other Topics Concern   Not on file  Social History Narrative   Not on file   Social Drivers of Health   Financial Resource Strain: Low Risk  (05/10/2023)   Received from [facility]   Overall Financial Resource Strain (CARDIA)    Difficulty of Paying Living Expenses: Not hard at all  Food Insecurity: No Food Insecurity (05/10/2023)   Received from [facility]   Hunger Vital Sign    Worried About Running Out of Food in the Last Year: Never true    Ran Out of Food in the Last Year: Never true  Transportation Needs: No Transportation Needs (05/10/2023)   Received from [facility] - Transportation    Lack of Transportation (Medical): No    Lack of Transportation (Non-Medical): No  Physical Activity: Not on file  Stress: Not on file  Social Connections: Not on file    Family History  Problem Relation Age of Onset   Migraines Mother    Migraines Brother    Cancer  Paternal Grandfather        melanoma    Current Outpatient Medications on File Prior to Visit  Medication Sig Dispense Refill   clobetasol  cream (TEMOVATE ) 0.05 % Apply 1 Application topically 2 (two) times daily for 14 days. 60 g 0   rizatriptan  (MAXALT -MLT) 10 MG disintegrating tablet Dissolve 1 tablet (10 mg total) by mouth daily as directed. 12 tablet 2   cetirizine (ZYRTEC) 10 MG tablet 1 tablet (Patient not taking: Reported on 12/20/2021)     clobetasol  ointment (TEMOVATE ) 0.05 % Mix with cerave lotion and apply to affected areas 2 times a day for 14 days 60 g 0   Ferric Maltol  (ACCRUFER ) 30 MG CAPS Take 1 capsule (30 mg total) by mouth 2 (two) times daily. (Patient not taking: Reported on 04/14/2024) 60 capsule 1   rosuvastatin  (CRESTOR ) 10 MG tablet Take 1 tablet (10 mg total) by mouth daily. 30 tablet 11   tranexamic acid  (LYSTEDA ) 650 MG TABS tablet Take 2 tablets (1,300 mg total) by mouth 3 (three) times daily for 5 days (Patient not taking: Reported on 04/14/2024) 30 tablet 1   [DISCONTINUED] norethindrone  (CAMILA ) 0.35 MG tablet Take 1 tablet (0.35 mg total) by mouth daily. 28 tablet 11   No current facility-administered medications on file prior to visit.    Allergies  Allergen Reactions   Chlorhexidine Rash    HIBICLENS DON'T NOT USE TO CLEANSE SURGICAL SITE   Cyanoacrylate Rash    SURGICAL GLUE = Severe rash    Other Rash    Severe rash      Objective:   Vitals:   04/14/24 1412  BP: 115/73  Pulse: 71  Weight: 179 lb (81.2 kg)  Height: 6' (1.829 m)   Physical Examination:   General appearance - well appearing, and in no distress  Mental status - alert, oriented to person, place, and time  Psych:  normal mood and affect    Assessment and Plan:  1. Abnormal uterine bleeding (Primary) Discussed reported symptoms and fibroids and possible treatments including medical management including OCPs, progesterone therapy, GnRH analogs, IUD as well as surgical options  including [place], hysteroscopy and hysterectomy. Will request records and consider next steps based on her specific findings  2. Uterine leiomyoma, unspecified location See above  3. Dermoid cyst Counseled on risks of malignant transformation of 0.2-2% for dermoid cysts and recommend surgical removal.  Will get ultrasound report first.    No follow-ups on file.  Future Appointments  Date Time Provider Department Center  04/29/2024  1:50 PM Alto Atta, Jodelle Mungo, MD [facility] None    Total time for visit was 37 minutes including chart review, patient counseling and documentation.  Marci Setter, MD, FACOG Obstetrician & Gynecologist, [facility] for [facility], [facility]

## 2024-04-29 ENCOUNTER — Telehealth: Payer: Self-pay | Admitting: Obstetrics and Gynecology

## 2024-04-29 ENCOUNTER — Other Ambulatory Visit

## 2024-04-29 ENCOUNTER — Encounter: Payer: Self-pay | Admitting: Obstetrics and Gynecology

## 2024-04-29 DIAGNOSIS — N939 Abnormal uterine and vaginal bleeding, unspecified: Secondary | ICD-10-CM

## 2024-04-29 NOTE — Progress Notes (Signed)
 GYNECOLOGY VIRTUAL VISIT ENCOUNTER NOTE  Provider location: Center for [facility] at [facility]   Patient location: Home  I connected with Lindsey Palmer on 04/29/24 at  1:50 PM EDT by MyChart Video Encounter and verified that I am speaking with the correct person using two identifiers.   I discussed the limitations, risks, security and privacy concerns of performing an evaluation and management service virtually and the availability of in person appointments. I also discussed with the patient that there may be a patient responsible charge related to this service. The patient expressed understanding and agreed to proceed.   History:  Lindsey Palmer is a 43 y.o. [phone] female presents for follow up for abnormal uterine bleeding   No office notes/ultrasound reports from [place] received to date. Patient has been thinking more seriously about hysterectomy, hoping to preserve ovaries Finds her periods heaviness and pain to be significantly impacting her life Has tried and failed medical therapy including norethindrone  May need something to manage her until the time of surgery     Past Medical History:  Diagnosis Date   Abnormal Pap smear    Anemia    Atypical mole 12/20/2021   Left Mid Back (moderate)   Atypical nevus 09/22/2010   mild mid back Dr Fleurette Humbles   Atypical nevus 09/22/2010   mild/moderate left back sup Dr Fleurette Humbles   Atypical nevus 09/22/2010   moderate left back inf Dr Fleurette Humbles   Atypical nevus 09/08/2015   mod/severe right lateral thigh Dr Fleurette Humbles   Atypical nevus 01/27/2016   severe mid upper back tx/ w/s   Atypical nevus 01/27/2016   severe mid left back tx w/s x2   Atypical nevus 07/15/2018   moderate right buttock   Atypical nevus 07/15/2018   mild left hamstring   Atypical nevus 07/15/2018   moderate left upper abdomen   H/O varicella    Headache(784.0)    Melanoma (HCC)    stage3   MM (malignant melanoma of skin) (HCC) 09/08/2015   MM anatomic level 4  left upper back tx Dr Fleurette Humbles   Urinary complications    Past Surgical History:  Procedure Laterality Date   ANKLE SURGERY     DILATION AND CURETTAGE OF UTERUS     lymphnode disection Left    The following portions of the patient's history were reviewed and updated as appropriate: allergies, current medications, past family history, past medical history, past social history, past surgical history and problem list.   Health Maintenance:  Need pap records from [facility]  Review of Systems:  Pertinent items noted in HPI and remainder of comprehensive ROS otherwise negative.  Physical Exam:   General:  Alert, oriented and cooperative. Patient appears to be in no acute distress.  Mental Status: Normal mood and affect. Normal behavior. Normal judgment and thought content.   Respiratory: Normal respiratory effort, no problems with respiration noted  Rest of physical exam deferred due to type of encounter  Labs and Imaging No results found for this or any previous visit (from the past 2 weeks). No results found.     Assessment and Plan:  1. Abnormal uterine bleeding (Primary) Obtain records from [facility], patient will update me Check labs Discussed management options for medical therapy including lysteda , provera, OCPs etc to bridge to surgery while obtaining records, she will do research and let me know - CBC; Future - Ferritin; Future    I discussed the assessment and treatment plan with the patient. The patient was provided an  opportunity to ask questions and all were answered. The patient agreed with the plan and demonstrated an understanding of the instructions.   The patient was advised to call back or seek an in-person evaluation/go to the ED if the symptoms worsen or if the condition fails to improve as anticipated.  I provided 17 minutes of face-to-face time during this encounter. I also spent 3 minutes dedicated to the care of this patient including pre-visit review of  records, post visit ordering of medications and appropriate tests or procedures, coordinating care and documenting this visit encounter.    Marci Setter, MD Center for [facility], [facility]

## 2024-04-30 ENCOUNTER — Observation Stay (HOSPITAL_COMMUNITY)
Admission: EM | Admit: 2024-04-30 | Discharge: 2024-05-01 | Disposition: A | Discharge status: Home / Self Care | Attending: Obstetrics & Gynecology | Admitting: Obstetrics & Gynecology

## 2024-04-30 ENCOUNTER — Other Ambulatory Visit: Payer: Self-pay | Admitting: Obstetrics and Gynecology

## 2024-04-30 ENCOUNTER — Emergency Department (HOSPITAL_BASED_OUTPATIENT_CLINIC_OR_DEPARTMENT_OTHER): Admission: EM | Admit: 2024-04-30 | Discharge: 2024-04-30 | Disposition: A | Discharge status: Home / Self Care

## 2024-04-30 ENCOUNTER — Other Ambulatory Visit: Payer: Self-pay

## 2024-04-30 ENCOUNTER — Encounter (HOSPITAL_COMMUNITY): Payer: Self-pay

## 2024-04-30 ENCOUNTER — Telehealth: Payer: Self-pay

## 2024-04-30 DIAGNOSIS — R0602 Shortness of breath: Secondary | ICD-10-CM | POA: Insufficient documentation

## 2024-04-30 DIAGNOSIS — N939 Abnormal uterine and vaginal bleeding, unspecified: Secondary | ICD-10-CM | POA: Diagnosis present

## 2024-04-30 DIAGNOSIS — D259 Leiomyoma of uterus, unspecified: Secondary | ICD-10-CM | POA: Insufficient documentation

## 2024-04-30 DIAGNOSIS — D500 Iron deficiency anemia secondary to blood loss (chronic): Principal | ICD-10-CM | POA: Insufficient documentation

## 2024-04-30 DIAGNOSIS — Z85820 Personal history of malignant melanoma of skin: Secondary | ICD-10-CM | POA: Diagnosis not present

## 2024-04-30 DIAGNOSIS — D649 Anemia, unspecified: Secondary | ICD-10-CM

## 2024-04-30 HISTORY — DX: Anemia, unspecified: D64.9

## 2024-04-30 LAB — PROTIME-INR
INR: 1 (ref 0.8–1.2)
Prothrombin Time: 13.5 s (ref 11.4–15.2)

## 2024-04-30 LAB — CBC WITH DIFFERENTIAL/PLATELET
Abs Immature Granulocytes: 0.02 10*3/uL (ref 0.00–0.07)
Basophils Absolute: 0 10*3/uL (ref 0.0–0.1)
Basophils Relative: 1 %
Eosinophils Absolute: 0.4 10*3/uL (ref 0.0–0.5)
Eosinophils Relative: 8 %
HCT: 19.8 % — ABNORMAL LOW (ref 36.0–46.0)
Hemoglobin: 5.6 g/dL — CL (ref 12.0–15.0)
Immature Granulocytes: 0 %
Lymphocytes Relative: 32 %
Lymphs Abs: 1.6 10*3/uL (ref 0.7–4.0)
MCH: 16.1 pg — ABNORMAL LOW (ref 26.0–34.0)
MCHC: 28.3 g/dL — ABNORMAL LOW (ref 30.0–36.0)
MCV: 56.9 fL — ABNORMAL LOW (ref 80.0–100.0)
Monocytes Absolute: 0.4 10*3/uL (ref 0.1–1.0)
Monocytes Relative: 7 %
Neutro Abs: 2.6 10*3/uL (ref 1.7–7.7)
Neutrophils Relative %: 52 %
Platelets: 375 10*3/uL (ref 150–400)
RBC: 3.48 MIL/uL — ABNORMAL LOW (ref 3.87–5.11)
RDW: 21.2 % — ABNORMAL HIGH (ref 11.5–15.5)
WBC: 5.1 10*3/uL (ref 4.0–10.5)
nRBC: 0 % (ref 0.0–0.2)

## 2024-04-30 LAB — COMPREHENSIVE METABOLIC PANEL WITH GFR
ALT: 28 U/L (ref 0–44)
AST: 22 U/L (ref 15–41)
Albumin: 3.8 g/dL (ref 3.5–5.0)
Alkaline Phosphatase: 53 U/L (ref 38–126)
Anion gap: 8 (ref 5–15)
BUN: 16 mg/dL (ref 6–20)
CO2: 25 mmol/L (ref 22–32)
Calcium: 8.7 mg/dL — ABNORMAL LOW (ref 8.9–10.3)
Chloride: 104 mmol/L (ref 98–111)
Creatinine, Ser: 0.83 mg/dL (ref 0.44–1.00)
GFR, Estimated: >60 mL/min (ref >60)
Glucose, Bld: 90 mg/dL (ref 70–99)
Potassium: 3.3 mmol/L — ABNORMAL LOW (ref 3.5–5.1)
Sodium: 137 mmol/L (ref 135–145)
Total Bilirubin: 0.6 mg/dL (ref 0.0–1.2)
Total Protein: 6.6 g/dL (ref 6.5–8.1)

## 2024-04-30 LAB — VITAMIN B12: Vitamin B-12: 524 pg/mL (ref 180–914)

## 2024-04-30 LAB — IRON AND TIBC
Iron: 10 ug/dL — ABNORMAL LOW (ref 28–170)
Saturation Ratios: 2 % — ABNORMAL LOW (ref 10.4–31.8)
TIBC: 517 ug/dL — ABNORMAL HIGH (ref 250–450)
UIBC: 507 ug/dL

## 2024-04-30 LAB — PREPARE RBC (CROSSMATCH)

## 2024-04-30 LAB — RETICULOCYTES
Immature Retic Fract: 16.3 % — ABNORMAL HIGH (ref 2.3–15.9)
RBC.: 3.48 MIL/uL — ABNORMAL LOW (ref 3.87–5.11)
Retic Count, Absolute: 52.2 10*3/uL (ref 19.0–186.0)
Retic Ct Pct: 1.5 % (ref 0.4–3.1)

## 2024-04-30 LAB — FERRITIN: Ferritin: 7 ng/mL — ABNORMAL LOW (ref 11–307)

## 2024-04-30 LAB — FOLATE: Folate: 17.6 ng/mL (ref >5.9)

## 2024-04-30 MED ORDER — ESTROGENS CONJUGATED 25 MG IJ SOLR
25.0000 mg | INTRAMUSCULAR | Status: AC
  Administered 2024-04-30 (×2): 25 mg via INTRAVENOUS
  Filled 2024-04-30 (×3): qty 25

## 2024-04-30 MED ORDER — SODIUM CHLORIDE 0.9% IV SOLUTION: Freq: Once | INTRAVENOUS | Status: AC

## 2024-04-30 MED ORDER — ACETAMINOPHEN 325 MG PO TABS
650.0000 mg | ORAL_TABLET | Freq: Once | ORAL | Status: AC
  Administered 2024-04-30: 650 mg via ORAL
  Filled 2024-04-30: qty 2

## 2024-04-30 MED ORDER — DIPHENHYDRAMINE HCL 25 MG PO CAPS
25.0000 mg | ORAL_CAPSULE | Freq: Once | ORAL | Status: AC
  Administered 2024-04-30: 25 mg via ORAL
  Filled 2024-04-30: qty 1

## 2024-04-30 MED ORDER — HYDROMORPHONE HCL 2 MG PO TABS
2.0000 mg | ORAL_TABLET | Freq: Four times a day (QID) | ORAL | Status: DC | PRN
  Administered 2024-05-01 (×2): 2 mg via ORAL
  Filled 2024-04-30 (×3): qty 1

## 2024-04-30 MED ORDER — ONDANSETRON HCL 4 MG/2ML IJ SOLN
4.0000 mg | Freq: Once | INTRAMUSCULAR | Status: AC
  Administered 2024-04-30: 4 mg via INTRAVENOUS
  Filled 2024-04-30: qty 2

## 2024-04-30 MED ORDER — MORPHINE SULFATE (PF) 4 MG/ML IV SOLN
4.0000 mg | Freq: Once | INTRAVENOUS | Status: AC
  Administered 2024-04-30: 4 mg via INTRAVENOUS
  Filled 2024-04-30: qty 1

## 2024-04-30 MED ORDER — FUROSEMIDE 10 MG/ML IJ SOLN
20.0000 mg | Freq: Once | INTRAMUSCULAR | Status: AC
  Administered 2024-04-30: 20 mg via INTRAVENOUS
  Filled 2024-04-30: qty 2

## 2024-04-30 NOTE — ED Notes (Signed)
 Patient laying in bed, alert and oriented x 3, denies pain. Informed patient of blood transfusion, once blood ready from lab. JRPRN

## 2024-04-30 NOTE — H&P (Addendum)
 Lindsey Palmer is an 43 y.o. female with menorrhagia, acute blood loss anemia and is symptomatic, being admitted to control bleeding and for blood transfusion Pelvic sono at [facility] obgyn w/ Dr Johnston Nao [phone]  Uterus slightly enlarged, 8.9 x 6.6 x 6.8 cm, 150cc, retroverted, single SM fibroid 3.7 x 3.2 x 2.8 cm. Complex R ovarian cys with solid portion 3.4 x 3.8cm, no internal vascularity, complex L ovarian cyst 2.5 x 1.8cm, no vascularity, both appear to be suggestive of endometrioma. Pt was on daily progestin, menses had stopped, now bleeding since 2 weeks.  [name], 2 kids, is done with childbearing  Malignant melanoma, 2016, with mets to LN, PET scan yearly for 5 yrs, now clear. Hence deferring estrogen.   GM with breast cancer late 60's, no other breast CA, no ovarian, no colon CA.  Nl pap Hx. HPV neg Nl mammograms   Patient's last menstrual period was 03/26/2024.    Past Medical History:  Diagnosis Date   Abnormal Pap smear    Anemia    Atypical mole 12/20/2021   Left Mid Back (moderate)   Atypical nevus 09/22/2010   mild mid back Dr Fleurette Humbles   Atypical nevus 09/22/2010   mild/moderate left back sup Dr Fleurette Humbles   Atypical nevus 09/22/2010   moderate left back inf Dr Fleurette Humbles   Atypical nevus 09/08/2015   mod/severe right lateral thigh Dr Fleurette Humbles   Atypical nevus 01/27/2016   severe mid upper back tx/ w/s   Atypical nevus 01/27/2016   severe mid left back tx w/s x2   Atypical nevus 07/15/2018   moderate right buttock   Atypical nevus 07/15/2018   mild left hamstring   Atypical nevus 07/15/2018   moderate left upper abdomen   H/O varicella    Headache(784.0)    Melanoma (HCC)    stage3   MM (malignant melanoma of skin) (HCC) 09/08/2015   MM anatomic level 4 left upper back tx Dr Fleurette Humbles   Urinary complications     Past Surgical History:  Procedure Laterality Date   ANKLE SURGERY     DILATION AND CURETTAGE OF UTERUS     lymphnode disection Left     Family History   Problem Relation Age of Onset   Migraines Mother    Migraines Brother    Cancer Paternal Grandfather        melanoma    Social History:  reports that she has never smoked. She has never used smokeless tobacco. She reports that she does not currently use alcohol. She reports that she does not use drugs.  Allergies:  Allergies  Allergen Reactions   Chlorhexidine Rash    HIBICLENS DON'T NOT USE TO CLEANSE SURGICAL SITE   Cyanoacrylate Rash    SURGICAL GLUE = Severe rash    Other Rash    Severe rash     (Not in a hospital admission)   Review of Systems  Blood pressure 123/81, pulse 78, temperature 98.3 F (36.8 C), temperature source Oral, resp. rate 18, height 6' (1.829 m), weight 81.2 kg, last menstrual period 03/26/2024, SpO2 100%. Physical Exam  Results for orders placed or performed during the hospital encounter of 04/30/24 (from the past 24 hours)  CBC with Differential     Status: Abnormal   Collection Time: 04/30/24  2:09 PM  Result Value Ref Range   WBC 5.1 4.0 - 10.5 K/uL   RBC 3.48 (L) 3.87 - 5.11 MIL/uL   Hemoglobin 5.6 (LL) 12.0 - 15.0 g/dL   HCT  19.8 (L) 36.0 - 46.0 %   MCV 56.9 (L) 80.0 - 100.0 fL   MCH 16.1 (L) 26.0 - 34.0 pg   MCHC 28.3 (L) 30.0 - 36.0 g/dL   RDW 16.1 (H) 09.6 - 04.5 %   Platelets 375 150 - 400 K/uL   nRBC 0.0 0.0 - 0.2 %   Neutrophils Relative % 52 %   Neutro Abs 2.6 1.7 - 7.7 K/uL   Lymphocytes Relative 32 %   Lymphs Abs 1.6 0.7 - 4.0 K/uL   Monocytes Relative 7 %   Monocytes Absolute 0.4 0.1 - 1.0 K/uL   Eosinophils Relative 8 %   Eosinophils Absolute 0.4 0.0 - 0.5 K/uL   Basophils Relative 1 %   Basophils Absolute 0.0 0.0 - 0.1 K/uL   WBC Morphology MORPHOLOGY UNREMARKABLE    Smear Review MORPHOLOGY UNREMARKABLE    Immature Granulocytes 0 %   Abs Immature Granulocytes 0.02 0.00 - 0.07 K/uL   Schistocytes PRESENT    Tear Drop Cells PRESENT    Ovalocytes PRESENT   Comprehensive metabolic panel     Status: Abnormal    Collection Time: 04/30/24  2:09 PM  Result Value Ref Range   Sodium 137 135 - 145 mmol/L   Potassium 3.3 (L) 3.5 - 5.1 mmol/L   Chloride 104 98 - 111 mmol/L   CO2 25 22 - 32 mmol/L   Glucose, Bld 90 70 - 99 mg/dL   BUN 16 6 - 20 mg/dL   Creatinine, Ser 4.09 0.44 - 1.00 mg/dL   Calcium  8.7 (L) 8.9 - 10.3 mg/dL   Total Protein 6.6 6.5 - 8.1 g/dL   Albumin 3.8 3.5 - 5.0 g/dL   AST 22 15 - 41 U/L   ALT 28 0 - 44 U/L   Alkaline Phosphatase 53 38 - 126 U/L   Total Bilirubin 0.6 0.0 - 1.2 mg/dL   GFR, Estimated >81 >19 mL/min   Anion gap 8 5 - 15  Protime-INR     Status: None   Collection Time: 04/30/24  2:09 PM  Result Value Ref Range   Prothrombin Time 13.5 11.4 - 15.2 seconds   INR 1.0 0.8 - 1.2  Type and screen [facility]     Status: None (Preliminary result)   Collection Time: 04/30/24  2:09 PM  Result Value Ref Range   ABO/RH(D) PENDING    Antibody Screen PENDING    Sample Expiration      05/03/2024,2359 Performed at [facility], [address], [place], [place] [address]   Vitamin B12     Status: None   Collection Time: 04/30/24  2:09 PM  Result Value Ref Range   Vitamin B-12 524 180 - 914 pg/mL  Iron and TIBC     Status: Abnormal   Collection Time: 04/30/24  2:09 PM  Result Value Ref Range   Iron 10 (L) 28 - 170 ug/dL   TIBC 956 (H) 213 - 086 ug/dL   Saturation Ratios 2 (L) 10.4 - 31.8 %   UIBC 507 ug/dL  Ferritin     Status: Abnormal   Collection Time: 04/30/24  2:09 PM  Result Value Ref Range   Ferritin 7 (L) 11 - 307 ng/mL  Reticulocytes     Status: Abnormal   Collection Time: 04/30/24  2:09 PM  Result Value Ref Range   Retic Ct Pct 1.5 0.4 - 3.1 %   RBC. 3.48 (L) 3.87 - 5.11 MIL/uL   Retic  Count, Absolute 52.2 19.0 - 186.0 K/uL   Immature Retic Fract 16.3 (H) 2.3 - 15.9 %    No results found.  Assessment/Plan: 43 yo with acute on chronic blood loss anemia secondary to menorrhagia, fibroid uterus.  Admit for  observation and blood transfusion.  Blood transfusion 3 pack RBCs today, each at 2 hr rate, CBC 4 hrs after 3rd unit. Verbal consent obtained and agrees.  Lasix 20mg , Benadryl  Tylenol  one time pre transfusion  Discussed IV premarin  2 doses to stop bleeding ASAP then PO Norethindrone  5 mg daily until hysterectomy by pt's choice. Do not plan to use estrogen long term due to personal hx of malignant melanoma Will see her as needed or in AM, likely discharge in AM after blood if bleeding is well controlled.   Pt and mother counseled, all agrees, all questions addressed to their satisfaction  Shasta Deist 04/30/2024, 3:58 PM [phone]

## 2024-04-30 NOTE — Telephone Encounter (Signed)
 S/w pt and notified of critical lab results. Advised pt that Dr. Alto Atta reviewed labs and ordered a transfusion. Advised pt to report to the ED, pt voiced understanding.

## 2024-04-30 NOTE — ED Provider Notes (Signed)
 [place] EMERGENCY DEPARTMENT AT [facility] Provider Note   CSN: [id number] Arrival date & time: 04/30/24  1248     Patient presents with: Abnormal labs and Vaginal Bleeding   Lindsey Palmer is a 43 y.o. female.   43 year old female with heavy vaginal bleeding. This cycle started 04/24/24, using pads and tampons and changing hourly although bleeding less currently. Has been feeling weak, near syncopal recently which prompted tele visit with GYN (Dr. Abigail) who did labs and called back today for Hgb 5.5. New to her GYN- has only had virtual visit with this provider, has been seen by Dr. Babetta Kaylyn), tried OCPs without improvement.  Currently reports SHOB, denies CP. Had pelvic US  about 4-5 months ago.   Has never had a blood transfusion previously. Would accept a transfusion if needed.        Prior to Admission medications   Medication Sig Start Date End Date Taking? Authorizing Provider  ibuprofen  (ADVIL ) 600 MG tablet Take 600 mg by mouth every 6 (six) hours as needed for moderate pain (pain score 4-6).   Yes [provider]  magnesium oxide (MAG-OX) 400 (240 Mg) MG tablet Take 400 mg by mouth daily.   Yes [provider]  Multiple Vitamins-Minerals (MULTIVITAMIN ADULTS) TABS Take 1 tablet by mouth daily.   Yes [provider]  norethindrone  (CAMILA ) 0.35 MG tablet Take 1 tablet (0.35 mg total) by mouth daily. 10/08/23 10/15/23      Allergies: Chlorhexidine, Cyanoacrylate, and Other    Review of Systems Negative except as per HPI Updated Vital Signs BP 123/81 (BP Location: Right Arm)   Pulse 78   Temp 98.3 F (36.8 C) (Oral)   Resp 18   Ht 6' (1.829 m)   Wt 81.2 kg   LMP 03/26/2024   SpO2 100%   BMI 24.28 kg/m   Physical Exam Vitals and nursing note reviewed.  Constitutional:      General: She is not in acute distress.    Appearance: She is well-developed. She is not diaphoretic.  HENT:     Head: Normocephalic and  atraumatic.   Cardiovascular:     Rate and Rhythm: Normal rate and regular rhythm.     Heart sounds: Normal heart sounds.  Pulmonary:     Effort: Pulmonary effort is normal.     Breath sounds: Normal breath sounds.  Abdominal:     Palpations: Abdomen is soft.     Tenderness: There is no abdominal tenderness.   Musculoskeletal:     Right lower leg: No edema.     Left lower leg: No edema.   Skin:    General: Skin is warm and dry.     Coloration: Skin is pale.     Findings: No erythema or rash.   Neurological:     Mental Status: She is alert and oriented to person, place, and time.   Psychiatric:        Behavior: Behavior normal.     (all labs ordered are listed, but only abnormal results are displayed) Labs Reviewed  CBC WITH DIFFERENTIAL/PLATELET - Abnormal; Notable for the following components:      Result Value   RBC 3.48 (*)    Hemoglobin 5.6 (*)    HCT 19.8 (*)    MCV 56.9 (*)    MCH 16.1 (*)    MCHC 28.3 (*)    RDW 21.2 (*)    All other components within normal limits  COMPREHENSIVE METABOLIC PANEL  WITH GFR - Abnormal; Notable for the following components:   Potassium 3.3 (*)    Calcium  8.7 (*)    All other components within normal limits  RETICULOCYTES - Abnormal; Notable for the following components:   RBC. 3.48 (*)    Immature Retic Fract 16.3 (*)    All other components within normal limits  PROTIME-INR  VITAMIN B12  FOLATE  IRON  AND TIBC  FERRITIN  TYPE AND SCREEN  PREPARE RBC (CROSSMATCH)    EKG: None  Radiology: No results found.   .Critical Care  Performed by: Beverley Leita LABOR, PA-C Authorized by: Beverley Leita LABOR, PA-C   Critical care provider statement:    Critical care time (minutes):  30   Critical care was time spent personally by me on the following activities:  Development of treatment plan with patient or surrogate, discussions with consultants, evaluation of patient's response to treatment, examination of patient, ordering and  review of laboratory studies, ordering and review of radiographic studies, ordering and performing treatments and interventions, pulse oximetry, re-evaluation of patient's condition and review of old charts    Medications Ordered in the ED  0.9 %  sodium chloride  infusion (Manually program via Guardrails IV Fluids) (has no administration in time range)  ondansetron  (ZOFRAN ) injection 4 mg (4 mg Intravenous Given 04/30/24 1404)  morphine  (PF) 4 MG/ML injection 4 mg (4 mg Intravenous Given 04/30/24 1405)    Clinical Course as of 04/30/24 1532  Thu Apr 30, 2024  1514 Hemoglobin(!!): 5.6 [JS]    Clinical Course User Index [JS] Soto, Johana, PA-C                                 Medical Decision Making Amount and/or Complexity of Data Reviewed Labs: ordered.  Risk Prescription drug management. Decision regarding hospitalization.   This patient presents to the ED for concern of anemia, this involves an extensive number of treatment options, and is a complaint that carries with it a high risk of complications and morbidity.  The differential diagnosis includes but not limited to symptomatic anemia    Co morbidities / Chronic conditions that complicate the patient evaluation  Melanoma, vaginal bleeding   Additional history obtained:  Additional history obtained from EMR External records from outside source obtained and reviewed including prior labs on file   Lab Tests:  I Ordered, and personally interpreted labs.  The pertinent results include: CBC with hemoglobin 5.6.  CMP with mild hypokalemia at 3.3.  INR normal.  Reticulocytes reviewed.   Problem List / ED Course / Critical interventions / Medication management  43 year old female presents with complaint of anemia.  Patient has been going to gynecology for heavy vaginal bleeding, currently 1 week into what has become a 2-week cycle for her.  She had a telehealth consult with gynecology yesterday who ordered labs and called  her today to tell her her hemoglobin is 5.5 and needed to come to the hospital for admission.  Patient reports feeling near syncopal for the past few days.  She is pale on exam.  She has had some lower abdominal discomfort and did have a pelvic ultrasound about 5 months ago.  Plan is to obtain labs to confirm the hemoglobin prior to ordering transfusion, I have discussed transfusion with the patient who provides consent.  Once labs resulted, likely call gynecology on-call for [facility]. I have reviewed the patients home medicines and have made adjustments  as needed   Consultations Obtained:  I requested consultation with the GYN, Dr. Barbette,  and discussed lab and imaging findings as well as pertinent plan - they recommend: requests admit to [facility], agrees with plan for transfusion    Social Determinants of Health:  Has GYN   Test / Admission - Considered:  Admit for transfusion       Final diagnoses:  Symptomatic anemia  Vaginal bleeding    ED Discharge Orders     None          Beverley Leita LABOR, PA-C 04/30/24 1532    Geraldene Hamilton, MD 05/04/24 2025

## 2024-04-30 NOTE — ED Notes (Signed)
 Patient stated she was told to come to [facility] ED for blood transfusion but wanted to see if we could do it. RN advised patient we were cable of treating her and was welcomed to check in, patient refused stating wanting to skip the middle man

## 2024-04-30 NOTE — ED Triage Notes (Signed)
 Pt c/o abnormal uterine bleeding and intermittent lower abdominal pain x6-8 months.  Currently, denies pain.  Pt has been seen several times for same.  Pt had lab work yesterday and resulted Hgb 5.5.  Pt reports weakness and dizziness.

## 2024-05-01 ENCOUNTER — Other Ambulatory Visit (HOSPITAL_COMMUNITY): Payer: Self-pay

## 2024-05-01 ENCOUNTER — Other Ambulatory Visit: Payer: Self-pay

## 2024-05-01 ENCOUNTER — Ambulatory Visit: Payer: Self-pay | Admitting: Obstetrics and Gynecology

## 2024-05-01 LAB — BPAM RBC
Blood Product Expiration Date: 202507172359
Blood Product Expiration Date: 202506252359
Blood Product Expiration Date: 202506252359
ISSUE DATE / TIME: 202506191822
ISSUE DATE / TIME: 202506192025
ISSUE DATE / TIME: 202506192247
Unit Type and Rh: 6200
Unit Type and Rh: 6200
Unit Type and Rh: 6200

## 2024-05-01 LAB — CBC
HCT: 28.2 % — ABNORMAL LOW (ref 36.0–46.0)
Hematocrit: 20.8 % — ABNORMAL LOW (ref 34.0–46.6)
Hemoglobin: 5.5 g/dL — CL (ref 11.1–15.9)
Hemoglobin: 8.5 g/dL — ABNORMAL LOW (ref 12.0–15.0)
MCH: 16.1 pg — ABNORMAL LOW (ref 26.6–33.0)
MCH: 19.6 pg — ABNORMAL LOW (ref 26.0–34.0)
MCHC: 26.4 g/dL — ABNORMAL LOW (ref 31.5–35.7)
MCHC: 30.1 g/dL (ref 30.0–36.0)
MCV: 61 fL — ABNORMAL LOW (ref 79–97)
MCV: 65.1 fL — ABNORMAL LOW (ref 80.0–100.0)
Platelets: 408 10*3/uL (ref 150–450)
Platelets: 322 10*3/uL (ref 150–400)
RBC: 3.41 x10E6/uL — ABNORMAL LOW (ref 3.77–5.28)
RBC: 4.33 MIL/uL (ref 3.87–5.11)
RDW: 19.1 % — ABNORMAL HIGH (ref 11.7–15.4)
RDW: 27 % — ABNORMAL HIGH (ref 11.5–15.5)
WBC: 5.2 10*3/uL (ref 3.4–10.8)
WBC: 6.9 10*3/uL (ref 4.0–10.5)
nRBC: 0 % (ref 0.0–0.2)

## 2024-05-01 LAB — TYPE AND SCREEN
ABO/RH(D): A POS
Antibody Screen: NEGATIVE
Unit division: 0
Unit division: 0
Unit division: 0

## 2024-05-01 LAB — FERRITIN: Ferritin: 12 ng/mL — ABNORMAL LOW (ref 15–150)

## 2024-05-01 MED ORDER — SLYND 4 MG PO TABS
1.0000 | ORAL_TABLET | Freq: Every day | ORAL | 2 refills | Status: DC
  Filled 2024-05-01: qty 28, 28d supply, fill #0

## 2024-05-01 MED ORDER — ONDANSETRON 4 MG PO TBDP
4.0000 mg | ORAL_TABLET | ORAL | Status: DC | PRN
  Administered 2024-05-01: 4 mg via ORAL
  Filled 2024-05-01 (×2): qty 1

## 2024-05-01 MED ORDER — ONDANSETRON 4 MG PO TBDP: 4.0000 mg | ORAL_TABLET | Freq: Two times a day (BID) | ORAL | Status: DC

## 2024-05-01 MED ORDER — SENNOSIDES-DOCUSATE SODIUM 8.6-50 MG PO TABS
2.0000 | ORAL_TABLET | Freq: Every day | ORAL | Status: DC
  Administered 2024-05-01: 2 via ORAL
  Filled 2024-05-01: qty 2

## 2024-05-01 MED ORDER — IRON SUCROSE 500 MG IVPB - SIMPLE MED
500.0000 mg | Freq: Once | INTRAVENOUS | Status: AC
  Administered 2024-05-01: 500 mg via INTRAVENOUS
  Filled 2024-05-01: qty 500

## 2024-05-01 MED ORDER — ORAL CARE MOUTH RINSE
15.0000 mL | OROMUCOSAL | Status: DC | PRN
Start: 2024-05-01 — End: 2024-05-01

## 2024-05-01 NOTE — Progress Notes (Signed)
   05/01/24 1354  TOC Brief Assessment  Insurance and Status Reviewed  Home environment has been reviewed From home with spouse  Prior level of function: Independent  Prior/Current Home Services No current home services  Social Drivers of Health Review SDOH reviewed no interventions necessary  Readmission risk has been reviewed Yes  Transition of care needs no transition of care needs at this time

## 2024-05-01 NOTE — Plan of Care (Signed)

## 2024-05-01 NOTE — Discharge Summary (Incomplete)
 Postpartum Discharge Summary  Date of Service updated***     Patient Name: Lindsey Palmer DOB: December 04, 1980 MRN: [id number]  Date of admission: 04/30/2024 Delivery date:This patient has no babies on file. Delivering provider: This patient has no babies on file. Date of discharge: 05/01/2024  Admitting diagnosis: Vaginal bleeding [N93.9] Anemia [D64.9] Severe anemia [D64.9] Symptomatic anemia [D64.9] Intrauterine pregnancy: Unknown     Secondary diagnosis:  Principal Problem:   Anemia Active Problems:   Severe anemia  Additional problems: ***    Discharge diagnosis: {DX.:23714}                                              Post partum procedures:{Postpartum procedures:23558} Augmentation: {Augmentation:20782} Complications: {OB Labor/Delivery Complications:20784}  Hospital course: {Courses:23701}  Magnesium Sulfate received: {Mag received:30440022} BMZ received: {BMZ received:30440023} Rhophylac:{Rhophylac received:30440032} GEX:{BMW:41324401} T-DaP:{Tdap:23962} Flu: {UUV:25366} RSV Vaccine received: {RSV:31013} Transfusion:{Transfusion received:30440034} Immunizations administered: Immunization History  Administered Date(s) Administered   Influenza Inj Mdck Quad Pf 08/27/2018   Influenza-Unspecified 09/02/2018   PPD Test 10/29/2014   Tdap 06/09/2016    Physical exam  Vitals:   04/30/24 2322 04/30/24 2345 05/01/24 0157 05/01/24 0534  BP: (!) 158/78 (!) 150/71 128/75 121/72  Pulse: 72 66 73 75  Resp: 14 15 15 18   Temp: 98.5 F (36.9 C) 98.2 F (36.8 C) 98.1 F (36.7 C) 98.3 F (36.8 C)  TempSrc: Oral Oral Oral Oral  SpO2: 99% 100% 100% 99%  Weight:      Height:       General: {Exam; general:21111117} Lochia: {Desc; appropriate/inappropriate:30686::appropriate} Uterine Fundus: {Desc; firm/soft:30687} Incision: {Exam; incision:21111123} DVT Evaluation: {Exam; dvt:2111122} Labs: Lab Results  Component Value Date   WBC 6.9 05/01/2024   HGB 8.5 (L)  05/01/2024   HCT 28.2 (L) 05/01/2024   MCV 65.1 (L) 05/01/2024   PLT 322 05/01/2024      Latest Ref Rng & Units 04/30/2024    2:09 PM  CMP  Glucose 70 - 99 mg/dL 90   BUN 6 - 20 mg/dL 16   Creatinine 4.40 - 1.00 mg/dL 3.47   Sodium 425 - 956 mmol/L 137   Potassium 3.5 - 5.1 mmol/L 3.3   Chloride 98 - 111 mmol/L 104   CO2 22 - 32 mmol/L 25   Calcium  8.9 - 10.3 mg/dL 8.7   Total Protein 6.5 - 8.1 g/dL 6.6   Total Bilirubin 0.0 - 1.2 mg/dL 0.6   Alkaline Phos 38 - 126 U/L 53   AST 15 - 41 U/L 22   ALT 0 - 44 U/L 28    Edinburgh Score:     No data to display            After visit meds:  Allergies as of 05/01/2024       Reactions   Chlorhexidine Rash   HIBICLENS DON'T NOT USE TO CLEANSE SURGICAL SITE   Cyanoacrylate Rash   SURGICAL GLUE = Severe rash    Other Rash   Severe rash         Medication List     STOP taking these medications    ibuprofen  600 MG tablet Commonly known as: ADVIL        TAKE these medications    magnesium oxide 400 (240 Mg) MG tablet Commonly known as: MAG-OX Take 400 mg by mouth daily.   Multivitamin Adults Tabs Take 1  tablet by mouth daily.   Slynd 4 MG Tabs Generic drug: Drospirenone Take 1 tablet (4 mg total) by mouth daily at 2 PM. Patient has a pack at home, do not dispense         Discharge home in stable condition Infant Feeding: {Baby feeding:23562} Infant Disposition:{CHL IP OB HOME WITH NWGNFA:21308} Discharge instruction: per After Visit Summary and Postpartum booklet. Activity: Advance as tolerated. Pelvic rest for 6 weeks.  Diet: {OB diet:21111121} Anticipated Birth Control: {Birth Control:23956} Postpartum Appointment:{Outpatient follow up:23559} Additional Postpartum F/U: {PP Procedure:23957} Future Appointments:No future appointments. Follow up Visit:  Follow-up Information     Terri Fester, MD Follow up in 1 week(s).   Specialty: Obstetrics and Gynecology Contact information: [address] [place] [address] [phone]                     05/01/2024 Shasta Deist, MD

## 2024-05-01 NOTE — Progress Notes (Signed)
 Subjective: Patient reports vomiting overnight, may be related to dilaudid  or premarin, better w/ Zofran  single dose. Ate reg diet this am. Bleeding is minimal. No dizziness/CP/SOB. Feels better w/ 3 units pRBCs  Objective: I have reviewed patient's vital signs, intake and output, medications, and labs.  General: alert, cooperative, and appears stated age Resp: clear to auscultation bilaterally Cardio: regular rate and rhythm, S1, S2 normal, no murmur, click, rub or gallop GI: soft, non-tender; bowel sounds normal; no masses,  no organomegaly Extremities: extremities normal, atraumatic, no cyanosis or edema Vaginal Bleeding: minimal     Latest Ref Rng & Units 05/01/2024    5:52 AM 04/30/2024    2:09 PM 04/29/2024    3:02 PM  CBC  WBC 4.0 - 10.5 K/uL 6.9  5.1  5.2   Hemoglobin 12.0 - 15.0 g/dL 8.5  5.6  5.5   Hematocrit 36.0 - 46.0 % 28.2  19.8  20.8   Platelets 150 - 400 K/uL 322  375  408      Assessment/Plan: Severe symptomatic anemia due to menorrhagia from uterine fibroid.  S/p 3 pRBCs. H/H better, symptoms better Menorrhagia stopped s/p 2 doses of IV Premarin, plan to start Slynd OC tonight (she has a pack at home)  Chronic blood loss anemia, low ferritin, poor PO intake due to intolerance. Needs IV Venofer 500mg  x 1 dose as she will be getting ready for hysterectomy and rapid increase in H/H is desired Discharge after Venofer,  f/up appointment is scheduled w/ Dr Ricky Charter in office next week. Warning ss dw pt. Monitor bleeding   LOS: 1 day    Shasta Deist, MD 05/01/2024, 10:13 AM

## 2024-05-01 NOTE — TOC Transition Note (Signed)
 Transition of Care [facility]) - Discharge Note   Patient Details  Name: Lindsey Palmer MRN: [id number] Date of Birth: Nov 27, 1980  Transition of Care [facility]) CM/SW Contact:  Loreda Rodriguez, RN Phone Number:[phone]  05/01/2024, 2:01 PM   Clinical Narrative:    Patient with discharge orders. No TOC needs noted.   Final next level of care: Home/Self Care Barriers to Discharge: No Barriers Identified   Patient Goals and CMS Choice     Choice offered to / list presented to : NA [place] ownership interest in [facility].provided to::  (n/a)    Discharge Placement                       Discharge Plan and Services Additional resources added to the After Visit Summary for                  DME Arranged: N/A DME Agency: NA       HH Arranged: NA HH Agency: NA        Social Drivers of Health (SDOH) Interventions SDOH Screenings   Food Insecurity: No Food Insecurity (04/30/2024)  Housing: Low Risk  (04/30/2024)  Transportation Needs: No Transportation Needs (04/30/2024)  Utilities: Not At Risk (04/30/2024)  Financial Resource Strain: Low Risk  (05/10/2023)   Received from [facility]  Tobacco Use: Low Risk  (04/30/2024)     Readmission Risk Interventions     No data to display

## 2024-05-04 ENCOUNTER — Emergency Department (HOSPITAL_COMMUNITY)
Admission: EM | Admit: 2024-05-04 | Discharge: 2024-05-04 | Disposition: A | Discharge status: Home / Self Care | Attending: Emergency Medicine | Admitting: Emergency Medicine

## 2024-05-04 ENCOUNTER — Ambulatory Visit: Payer: Self-pay | Admitting: *Deleted

## 2024-05-04 ENCOUNTER — Other Ambulatory Visit: Payer: Self-pay

## 2024-05-04 ENCOUNTER — Encounter (HOSPITAL_COMMUNITY): Payer: Self-pay | Admitting: *Deleted

## 2024-05-04 DIAGNOSIS — I808 Phlebitis and thrombophlebitis of other sites: Secondary | ICD-10-CM | POA: Diagnosis not present

## 2024-05-04 DIAGNOSIS — M7989 Other specified soft tissue disorders: Secondary | ICD-10-CM | POA: Diagnosis present

## 2024-05-04 DIAGNOSIS — T801XXA Vascular complications following infusion, transfusion and therapeutic injection, initial encounter: Secondary | ICD-10-CM

## 2024-05-04 DIAGNOSIS — I809 Phlebitis and thrombophlebitis of unspecified site: Secondary | ICD-10-CM

## 2024-05-04 HISTORY — DX: Vascular complications following infusion, transfusion and therapeutic injection, initial encounter: T80.1XXA

## 2024-05-04 HISTORY — DX: Phlebitis and thrombophlebitis of unspecified site: I80.9

## 2024-05-04 LAB — CBC WITH DIFFERENTIAL/PLATELET
Abs Immature Granulocytes: 0.04 10*3/uL (ref 0.00–0.07)
Basophils Absolute: 0 10*3/uL (ref 0.0–0.1)
Basophils Relative: 0 %
Eosinophils Absolute: 0.3 10*3/uL (ref 0.0–0.5)
Eosinophils Relative: 3 %
HCT: 34.1 % — ABNORMAL LOW (ref 36.0–46.0)
Hemoglobin: 10.2 g/dL — ABNORMAL LOW (ref 12.0–15.0)
Immature Granulocytes: 0 %
Lymphocytes Relative: 18 %
Lymphs Abs: 1.8 10*3/uL (ref 0.7–4.0)
MCH: 20.7 pg — ABNORMAL LOW (ref 26.0–34.0)
MCHC: 29.9 g/dL — ABNORMAL LOW (ref 30.0–36.0)
MCV: 69.3 fL — ABNORMAL LOW (ref 80.0–100.0)
Monocytes Absolute: 0.8 10*3/uL (ref 0.1–1.0)
Monocytes Relative: 8 %
Neutro Abs: 6.9 10*3/uL (ref 1.7–7.7)
Neutrophils Relative %: 71 %
Platelets: 401 10*3/uL — ABNORMAL HIGH (ref 150–400)
RBC: 4.92 MIL/uL (ref 3.87–5.11)
RDW: 31.3 % — ABNORMAL HIGH (ref 11.5–15.5)
WBC: 9.8 10*3/uL (ref 4.0–10.5)
nRBC: 0 % (ref 0.0–0.2)

## 2024-05-04 LAB — BASIC METABOLIC PANEL WITH GFR
Anion gap: 10 (ref 5–15)
BUN: 21 mg/dL — ABNORMAL HIGH (ref 6–20)
CO2: 22 mmol/L (ref 22–32)
Calcium: 9 mg/dL (ref 8.9–10.3)
Chloride: 106 mmol/L (ref 98–111)
Creatinine, Ser: 0.77 mg/dL (ref 0.44–1.00)
GFR, Estimated: >60 mL/min (ref >60)
Glucose, Bld: 88 mg/dL (ref 70–99)
Potassium: 3.7 mmol/L (ref 3.5–5.1)
Sodium: 138 mmol/L (ref 135–145)

## 2024-05-04 LAB — HCG, SERUM, QUALITATIVE: Preg, Serum: NEGATIVE

## 2024-05-04 MED ORDER — HYDROCODONE-ACETAMINOPHEN 5-325 MG PO TABS
1.0000 | ORAL_TABLET | ORAL | 0 refills | Status: DC | PRN
  Filled 2024-05-04: qty 10, 2d supply, fill #0

## 2024-05-04 MED ORDER — MORPHINE SULFATE (PF) 4 MG/ML IV SOLN
4.0000 mg | Freq: Once | INTRAVENOUS | Status: AC
  Administered 2024-05-04: 4 mg via INTRAMUSCULAR
  Filled 2024-05-04: qty 1

## 2024-05-04 MED ORDER — IBUPROFEN 600 MG PO TABS
600.0000 mg | ORAL_TABLET | Freq: Four times a day (QID) | ORAL | 0 refills | Status: AC | PRN
  Filled 2024-05-04: qty 30, 8d supply, fill #0

## 2024-05-04 NOTE — ED Provider Notes (Signed)
 [place] EMERGENCY DEPARTMENT AT [facility] Provider Note   CSN: [id number] Arrival date & time: 05/04/24  1658     Patient presents with: R arm redness post transfusion/infusion   Lindsey Palmer is a 43 y.o. female.   Patient to ED with concern for painful, red swollen area to right arm. She reports having a blood transfusion along with an iron  infusion 4 days ago after blood loss from fibroids. No bleeding now. The right AC area has becoming progressively more painful with a red streak to mid-bicep today. No fever. The pain has become significantly worse to where she cannot fully extend the arm at the elbow today.   The history is provided by the patient. No language interpreter was used.       Prior to Admission medications   Medication Sig Start Date End Date Taking? Authorizing Provider  HYDROcodone-acetaminophen  (NORCO/VICODIN) 5-325 MG tablet Take 1 tablet by mouth every 4 (four) hours as needed. 05/04/24  Yes Marsden Zaino, Margit, PA-C  ibuprofen  (ADVIL ) 600 MG tablet Take 1 tablet (600 mg total) by mouth every 6 (six) hours as needed. 05/04/24  Yes Darius Lundberg, Margit, PA-C  Drospirenone  (SLYND ) 4 MG TABS Take 1 tablet (4 mg total) by mouth daily at 2 PM. Patient has a pack at home, do not dispense 05/01/24 07/24/24  Barbette Knock, MD  magnesium oxide (MAG-OX) 400 (240 Mg) MG tablet Take 400 mg by mouth daily.    [provider]  Multiple Vitamins-Minerals (MULTIVITAMIN ADULTS) TABS Take 1 tablet by mouth daily.    [provider]  norethindrone  (CAMILA ) 0.35 MG tablet Take 1 tablet (0.35 mg total) by mouth daily. 10/08/23 10/15/23      Allergies: Chlorhexidine, Cyanoacrylate, and Other    Review of Systems  Updated Vital Signs BP (!) 144/92   Pulse 79   Temp 98.3 F (36.8 C) (Oral)   Resp 17   Ht 6' (1.829 m)   Wt 77.1 kg   LMP 03/26/2024   SpO2 100%   BMI 23.06 kg/m   Physical Exam Vitals and nursing note reviewed.  Constitutional:       General: She is not in acute distress.    Appearance: She is well-developed. She is not ill-appearing.  Pulmonary:     Effort: Pulmonary effort is normal.   Musculoskeletal:        General: Normal range of motion.     Cervical back: Normal range of motion.     Comments: Redness and inflammation of the right AC with palpable cord extending proximally that is tender to touch. Pulses are intact distally.  Lymphadenopathy:     Upper Body:     Right upper body: No axillary adenopathy.   Skin:    General: Skin is warm and dry.   Neurological:     Mental Status: She is alert and oriented to person, place, and time.     (all labs ordered are listed, but only abnormal results are displayed) Labs Reviewed  CBC WITH DIFFERENTIAL/PLATELET - Abnormal; Notable for the following components:      Result Value   Hemoglobin 10.2 (*)    HCT 34.1 (*)    MCV 69.3 (*)    MCH 20.7 (*)    MCHC 29.9 (*)    RDW 31.3 (*)    Platelets 401 (*)    All other components within normal limits  BASIC METABOLIC PANEL WITH GFR - Abnormal; Notable for the following components:   BUN 21 (*)  All other components within normal limits  HCG, SERUM, QUALITATIVE   Results for orders placed or performed during the hospital encounter of 05/04/24  CBC with Differential   Collection Time: 05/04/24  6:12 PM  Result Value Ref Range   WBC 9.8 4.0 - 10.5 K/uL   RBC 4.92 3.87 - 5.11 MIL/uL   Hemoglobin 10.2 (L) 12.0 - 15.0 g/dL   HCT 65.8 (L) 63.9 - 53.9 %   MCV 69.3 (L) 80.0 - 100.0 fL   MCH 20.7 (L) 26.0 - 34.0 pg   MCHC 29.9 (L) 30.0 - 36.0 g/dL   RDW 68.6 (H) 88.4 - 84.4 %   Platelets 401 (H) 150 - 400 K/uL   nRBC 0.0 0.0 - 0.2 %   Neutrophils Relative % 71 %   Neutro Abs 6.9 1.7 - 7.7 K/uL   Lymphocytes Relative 18 %   Lymphs Abs 1.8 0.7 - 4.0 K/uL   Monocytes Relative 8 %   Monocytes Absolute 0.8 0.1 - 1.0 K/uL   Eosinophils Relative 3 %   Eosinophils Absolute 0.3 0.0 - 0.5 K/uL   Basophils Relative  0 %   Basophils Absolute 0.0 0.0 - 0.1 K/uL   Immature Granulocytes 0 %   Abs Immature Granulocytes 0.04 0.00 - 0.07 K/uL   Hypersegmented Neutrophils PRESENT    Acanthocytes PRESENT    Schistocytes PRESENT    Tear Drop Cells PRESENT    Polychromasia PRESENT    Ovalocytes PRESENT   Basic metabolic panel   Collection Time: 05/04/24  6:12 PM  Result Value Ref Range   Sodium 138 135 - 145 mmol/L   Potassium 3.7 3.5 - 5.1 mmol/L   Chloride 106 98 - 111 mmol/L   CO2 22 22 - 32 mmol/L   Glucose, Bld 88 70 - 99 mg/dL   BUN 21 (H) 6 - 20 mg/dL   Creatinine, Ser 9.22 0.44 - 1.00 mg/dL   Calcium  9.0 8.9 - 10.3 mg/dL   GFR, Estimated >39 >39 mL/min   Anion gap 10 5 - 15  hCG, serum, qualitative   Collection Time: 05/04/24  6:12 PM  Result Value Ref Range   Preg, Serum NEGATIVE NEGATIVE     EKG: None  Radiology: No results found.   Procedures   Medications Ordered in the ED  morphine  (PF) 4 MG/ML injection 4 mg (4 mg Intramuscular Given 05/04/24 2119)    Clinical Course as of 05/04/24 2200  Mon May 04, 2024  2108 Patient to ED with pain and redness as described in the HPI to right [facility] s/p blood transfusion and iron  infusion 4 days ago. No fever.  [SU]  2158 Labs reassuring. Exam supports a dx of superficial thrombophlebitis. She expresses concern about the diagnosis and Dr. Elnor is asked to see her, who feels this is the correct diagnosis. Will provide pain relief, provide information on diagnosis. Encourage recheck by PCP this week.  [SU]    Clinical Course User Index [SU] Odell Balls, PA-C                                 Medical Decision Making Risk Prescription drug management.        Final diagnoses:  Superficial thrombophlebitis of right upper extremity    ED Discharge Orders          Ordered    ibuprofen  (ADVIL ) 600 MG tablet  Every 6 hours PRN  05/04/24 2151    HYDROcodone-acetaminophen  (NORCO/VICODIN) 5-325 MG tablet  Every 4 hours PRN         05/04/24 2151               Odell Balls, PA-C 05/04/24 2200    Elnor Bernarda SQUIBB, DO 05/05/24 1940

## 2024-05-04 NOTE — Telephone Encounter (Signed)
 Copied from CRM [id number]. Topic: Clinical - Red Word Triage >> May 04, 2024  3:28 PM Everette C wrote: Red Word that prompted transfer to Nurse Triage: The patient was recently seen for a blood transfusion and iron  infusion on 04/30/24. The patient shares that their right arm is now swollen and they are unable to move their arm Reason for Disposition  Sounds like a serious injury to the triager    Referred to ED.  Answer Assessment - Initial Assessment Questions 1. MECHANISM: How did the injury happen?     My right arm is swollen from where I got an iron  infusion and a blood infusion on 04/30/2024.  I was in the ED.   It hurts to bend it.   I can't even eat with my right arm because I can't bend it enough to bring my fork to my mouth.    The site is in the crease of my arm but it hurts going up my upper arm.   It feels stiff and firm along the vein.   My arm was not swelling or burning during the infusion.   No pain during either of the infusions.    2. ONSET: When did the injury happen? (Minutes or hours ago)      I was in the ED when this happened.     3. LOCATION: Where is the injury located? Which arm?     Right arm in the crease  4. APPEARANCE of INJURY: What does the injury look like?      It's firm at the vein and going up my arm. 5. SEVERITY: Can you use the arm normally?      I can't pick up anything.   I can't pick up a water bottle.   I can't bend my arm to even eat.    6. SWELLING or BRUISING: is there any swelling or bruising? If Yes, ask: How large is it? (e.g., inches, centimeters)      I don't see any bruising.   It's red at the site.   The tape irritated me too.   It's a little swollen. 7. PAIN: Is there pain? If Yes, ask: How bad is the pain?    (Scale 1-10; or mild, moderate, severe)   - NONE (0): No pain.   - MILD (1-3): Doesn't interfere with normal activities.   - MODERATE (4-7): Interferes with normal activities (e.g., work or school) or awakens from  sleep.   - SEVERE (8-10): Excruciating pain, unable to do any normal activities, unable to hold a cup of water.     Painful on upper arm along the vein going up from the site in the crease of my arm. 8. TETANUS: For any breaks in the skin, ask: When was the last tetanus booster?     N/A 9. OTHER SYMPTOMS: Do you have any other symptoms?  (e.g., numbness in hand)     No numbness or tingling in hand.   10. PREGNANCY: Is there any chance you are pregnant? When was your last menstrual period?       Not asked  Protocols used: Arm Injury-A-AH Pt's PCP is with [facility].   I have referred pt to ED where she got the blood and iron  infusion to have her arm evaluated.  Pt. Agreeable to going.          FYI Only or Action Required?: FYI only for provider.  Patient was last seen in primary care on  .  Called Nurse Triage reporting Arm Injury. Symptoms began several days ago. Interventions attempted: Nothing. Symptoms are: rapidly worsening.  Triage Disposition: Go to ED Now (Notify PCP)  Patient/caregiver understands and will follow disposition?:   Yes

## 2024-05-04 NOTE — ED Triage Notes (Signed)
 BIB family from home for R arm redness, heat, swelling, pain and TTP s/p transfusion & infusion (PRBC and iron ) on 6/19. Sx onset 6/20. Tylenol  1000mg  taken at 0530. Rates pain 4/10 when still, 7/10 with movement. Denies fever or other sx. Alert, NAD, calm, interactive.

## 2024-05-04 NOTE — Discharge Instructions (Signed)
 Apply warm compresses for comfort. Take the medications as prescribed. Please plan to follow up with your doctor for recheck this week.

## 2024-05-04 NOTE — ED Provider Triage Note (Signed)
 Emergency Medicine Provider Triage Evaluation Note  Lindsey Palmer , a 43 y.o. female  was evaluated in triage.  Pt complains of right arm redness at right ac. Recent admission for blood transfusion s/p vag bleeding. Hurts to move arm. Swelling, redness, warmth, extending up the arm  Review of Systems  Positive: Arm pain Negative:   Physical Exam  BP (!) 144/92   Pulse 79   Temp 98.3 F (36.8 C) (Oral)   Resp 17   Ht 6' (1.829 m)   Wt 77.1 kg   LMP 03/26/2024   SpO2 100%   BMI 23.06 kg/m  Gen:   Awake, no distress   Resp:  Normal effort  MSK:   Moves extremities without difficulty , decreased ROM to right AC. Redness, warmth, palpable cord Other:    Medical Decision Making  Medically screening exam initiated at 6:07 PM.  Appropriate orders placed.  Lindsey Palmer was informed that the remainder of the evaluation will be completed by another provider, this initial triage assessment does not replace that evaluation, and the importance of remaining in the ED until their evaluation is complete.  Right ac redness   Lindsey Palmer A, PA-C 05/04/24 1809

## 2024-05-04 NOTE — Progress Notes (Signed)
 Patient ID:  Lindsey Palmer is a 43 y.o.  Mar 18, 1981   female     Swelling (Pt was at the ER and had a blood transfusion evening of the 19th and is having swelling and redness to R elbow area. Pt cannot bend arm. Pt got 3 units. Pt had a bleed in her uterus. This was done at [facility]. No cancer meds. Hx of Melanoma 3)     New problem to me Accompanied by:                 self and partner History given by patient:  Patient had blood transfusion at [facility] where 3 units of blood were administered. Patient has been having increased swelling, redness, and streaking that has been going up the right arm where they injected blood and has concerns that it may be infected.  No previous history of abscess/MRSA Duration: 4 days Location: right upper arm Intensity/quality:                  Moderate to severe pain constant ache with sharp pain with manipulation Draining:                              no Feverish:                             yes Timing:                                progressively worsening  Alleviate:                             Has tried moist compresses and OTC meds with minimal relief  Aggravate:                          Manipulation  No N/V No Diarrhea/Constipation No Urinary symptoms No other skin lesion No fever No URI symptoms Review of Systems:  All others reviewed and negative except as listed above.   Past Medical History, Past surgery History, Allergies, Social history, and Family History were reviewed and updated.     Exam: BP 136/75   Pulse 71   Temp 99 F (37.2 C) (Tympanic)   Resp 19   Ht 1.829 m (6')   Wt 79.4 kg (175 lb)   SpO2 100%   BMI 23.73 kg/m   Constitutional: Well-nourished, well-developed no acute distress Neuro: Alert and oriented x3 Psych:  normal affect Head: Atraumatic normocephalic Lymphatics: No lymphadenopathy CV: Regular rate and rhythm without murmurs rubs or gallops Respiratory: Clear to auscultation bilaterally  without wheezes rales or rhonchi Skin:  Location: streaking noted right upper arm, erythema, tenderness, edema, no drainage. Right anterior elbow going up mid shaft arm     No results found for this or any previous visit (from the past 24 hours).    Impression/Plan Incision and drainage was not performed. Procedure note above.  Likely abscess. No indication of systemic infection or sepsis.   1. Phlebitis of right arm (Primary)  2. Cellulitis of right upper arm     Patient has been instructed on RX/ OTC medications, dosages, side effects, and possible interactions as associated with each diagnosis in my impression and plan above.  2.  Patient education  (verbal/handout) given on diagnosis, pathophysiology, treatment of diagnosis, side effects of medication use for treatment, restrictions while taking medication, supportive       measures such  as  keeping wound dressed and bandaged, warm moist compresses/soaks.        Red Flags associated with diagnosis/es were reviewed and patient instructed on action plan if red flags develop.  3.  Patient was instructed on follow up care:              Sent Directly to the ED by EMS/POV               They have been instructed that if symptoms worse that should go to Urgent Care, go to the nearest ED, or activate EMS.  4.  Patient agreed with plan and voiced understanding.  NO barriers to adherence perceived by myself. 5. Patient advised to go to the ED to evaluated phlebitis vs sepsis vs cellulitis vs other. Urgent Care Disposition:  Follow up with ED

## 2024-05-05 ENCOUNTER — Other Ambulatory Visit: Payer: Self-pay

## 2024-05-05 ENCOUNTER — Other Ambulatory Visit (HOSPITAL_COMMUNITY): Payer: Self-pay

## 2024-05-06 ENCOUNTER — Other Ambulatory Visit (HOSPITAL_COMMUNITY): Payer: Self-pay

## 2024-05-06 MED ORDER — MEGESTROL ACETATE 40 MG PO TABS
40.0000 mg | ORAL_TABLET | Freq: Every day | ORAL | 1 refills | Status: DC
  Filled 2024-05-06: qty 30, 30d supply, fill #0

## 2024-05-11 ENCOUNTER — Other Ambulatory Visit: Payer: Self-pay | Admitting: Obstetrics & Gynecology

## 2024-05-20 ENCOUNTER — Encounter (HOSPITAL_COMMUNITY): Payer: Self-pay | Admitting: Obstetrics & Gynecology

## 2024-05-21 ENCOUNTER — Encounter (HOSPITAL_COMMUNITY): Payer: Self-pay | Admitting: Obstetrics & Gynecology

## 2024-05-25 ENCOUNTER — Encounter (HOSPITAL_COMMUNITY): Payer: Self-pay | Admitting: Obstetrics & Gynecology

## 2024-05-25 NOTE — Progress Notes (Signed)
 Spoke w/ via phone for pre-op interview--- pt Lab needs dos---- t&s/ upt      (had transfusion with in past 3 months) Lab results------ lab appt 05-27-2024 @1300  getting CBC/ BMP COVID test -----patient states asymptomatic no test needed Arrive at ------- 0530 on 05-28-2024 NPO after MN w/ exception sips of water w/ meds Pre-Surgery Ensure or G2:  Med rec completed Medications to take morning of surgery ----- if needed may take maxalt   Diabetic medication ----- n/a  GLP1 agonist last dose: GLP1 instructions:  Patient instructed no nail polish to be worn day of surgery Patient instructed to bring photo id and insurance card day of surgery Patient aware to have Driver (ride ) / caregiver    for 24 hours after surgery - husband, Lindsey Palmer Patient Special Instructions ----- will pick up written instructions at lab appt (pt allergic to CHG will use dial soap) Pre-Op special Instructions -----  pt dx recent phlebitis 0623/2025 right AC post transfusions   Patient verbalized understanding of instructions that were given at this phone interview. Patient denies chest pain, sob, fever, cough at the interview.

## 2024-05-25 NOTE — Pre-Procedure Instructions (Addendum)
 Surgical Instructions   Your procedure is scheduled on :  Thursday,  05-28-2024. Report to [facility] Main Entrance A at 5:30  A.M., then check in with the Admitting office. Any questions or running late day of surgery: call [phone]  Questions prior to your surgery date: call [phone], Monday-Friday, 8am-4pm. If you experience any cold or flu symptoms such as cough, fever, chills, shortness of breath, etc. between now and your scheduled surgery, please notify your surgeon office.    Remember:  Do not eat any food and do not drink any liquids after midnight the night before your surgery.  This includes no water,  candy,  gum,  and  mints.    Take these medicines the morning of surgery with A SIP OF WATER :  none   May take these medicines IF NEEDED: sips of water Rizatriptan  (maxalt )    One week prior to surgery, STOP taking any Aspirin (unless otherwise instructed by your surgeon) Aleve, Naproxen, Ibuprofen , Motrin , Advil , Goody's, BC's, all herbal medications, fish oil, and non-prescription vitamins.                     Do NOT Smoke (Tobacco/Vaping) and Do Not drink alcohol for 24 hours prior to your procedure.  If you use a CPAP at night, you may bring your mask/headgear for your overnight stay.   You will be asked to remove any contacts, glasses, piercing's, hearing aid's, dentures/partials prior to surgery. Please bring cases for these items if needed.    Patients discharged the day of surgery will not be allowed to drive home, and someone needs to stay with them for 24 hours.  SURGICAL WAITING ROOM VISITATION Patients may have no more than 2 support people in the waiting area - these visitors may rotate.   Pre-op nurse will coordinate an appropriate time for 1 ADULT support person, who may not rotate, to accompany patient in pre-op.  Children under the age of 75 must have an adult with them who is not the patient and must remain in the main waiting area with an  adult.  If the patient needs to stay at the hospital during part of their recovery, the visitor guidelines for inpatient rooms apply.  Please refer to the [facility] website for the visitor guidelines for any additional information.   If you received a COVID test during your pre-op visit  it is requested that you wear a mask when out in public, stay away from anyone that may not be feeling well and notify your surgeon if you develop symptoms. If you have been in contact with anyone that has tested positive in the last 10 days please notify you surgeon.      Pre-operative  Bathing Instructions   You can play a key role in reducing the risk of infection after surgery. Your skin needs to be as free of germs as possible. You can reduce the number of germs on your skin by with antibacterial soap before surgery.  Antibacterial soap that kills germs.    ~~ Please get dial soap and following the instructions below.             TAKE A SHOWER THE NIGHT BEFORE SURGERY AND THE DAY OF SURGERY    Please keep in mind the following:  DO NOT shave, genital area, 48 hours prior to surgery.   You may shave your face before/day of surgery.  Place clean sheets on your bed the night before surgery  Use a clean washcloth (not used since being washed) for each shower. DO NOT sleep with pet's night before surgery.           DIAL Shower Instructions:  Oncologist and private area with normal soap. If you choose to wash your hair, wash first with your normal shampoo.  After you use shampoo/soap, rinse your hair and body thoroughly to remove shampoo/soap residue.  Turn the water OFF and apply dial soap to a CLEAN washcloth.  Apply soap ONLY FROM YOUR NECK DOWN TO YOUR TOES (washing for 3-5 minutes)  DO NOT use on your face Pay special attention to the area where your surgery is being performed.  If you are having back surgery, having someone wash your back for you may be helpful. Wait 2 minutes after dial  soap is applied, then you may rinse off the dial soap.  Pat dry with a clean towel  Put on clean pajamas    Additional instructions for the day of surgery: DO NOT APPLY any lotions,  powder,  oils,  deodorants (may use underarm deodorant) , cologne/  perfumes or makeup Do not wear jewelry / piercing's/ metal/ permanent jewelry must be removed prior to arrival day of surgery.  (No plastic piercing) Do not wear nail polish, gel polish, artificial nails, or any other type of covering on natural finger nails (toe nails are okay) Do not bring valuables to the hospital. [facility] is not responsible for valuables/personal belongings. Put on clean/comfortable clothes.  Please brush your teeth.  Ask your nurse before applying any prescription medications to the skin.

## 2024-05-27 ENCOUNTER — Encounter (HOSPITAL_COMMUNITY)
Admission: RE | Admit: 2024-05-27 | Discharge: 2024-05-27 | Disposition: A | Discharge status: Home / Self Care | Source: Ambulatory Visit | Attending: Obstetrics & Gynecology | Admitting: Obstetrics & Gynecology

## 2024-05-27 DIAGNOSIS — Z01812 Encounter for preprocedural laboratory examination: Secondary | ICD-10-CM | POA: Insufficient documentation

## 2024-05-27 LAB — BASIC METABOLIC PANEL WITH GFR
Anion gap: 10 (ref 5–15)
BUN: 14 mg/dL (ref 6–20)
CO2: 23 mmol/L (ref 22–32)
Calcium: 9 mg/dL (ref 8.9–10.3)
Chloride: 102 mmol/L (ref 98–111)
Creatinine, Ser: 0.76 mg/dL (ref 0.44–1.00)
GFR, Estimated: >60 mL/min (ref >60)
Glucose, Bld: 104 mg/dL — ABNORMAL HIGH (ref 70–99)
Potassium: 3.6 mmol/L (ref 3.5–5.1)
Sodium: 135 mmol/L (ref 135–145)

## 2024-05-27 LAB — CBC
HCT: 26.6 % — ABNORMAL LOW (ref 36.0–46.0)
Hemoglobin: 8 g/dL — ABNORMAL LOW (ref 12.0–15.0)
MCH: 23.5 pg — ABNORMAL LOW (ref 26.0–34.0)
MCHC: 30.1 g/dL (ref 30.0–36.0)
MCV: 78.2 fL — ABNORMAL LOW (ref 80.0–100.0)
Platelets: 308 K/uL (ref 150–400)
RBC: 3.4 MIL/uL — ABNORMAL LOW (ref 3.87–5.11)
WBC: 4.7 K/uL (ref 4.0–10.5)
nRBC: 0 % (ref 0.0–0.2)

## 2024-05-27 NOTE — H&P (Signed)
 Lindsey Palmer is an 43 y.o. female with menorrhagia, acute blood loss anemia and is symptomatic, was recently admitted to control bleeding and for blood transfusion for severe anemia, Hgb 5.5  Currently bleeding has slowed down some with Megace  but picked up again after endometrial bx that was just filled with blood.   Office pelvic ultrasound-uterus 10.2 x 7 x 7.2 cm, 203 cc, EMS 44 mm, clot and debris noted.  No increased blood flow. No submucosal myoma, An intramural myoma on right 3.3 x 3.3 cm. Right ovary noted best via transabdominal ultrasound simple cyst noted 3.4 x 3 x 3.1 cm with peripheral blood flow.  Left ovary not visualized well due to overlying bowel filled with air. moderate ff in cul de sac and right adnexa  Lindsey Palmer, 2 kids, is done with childbearing.   Malignant melanoma, 2016, with mets to LN, PET scan yearly for 5 yrs, now clear. Hence deferring estrogen.   Lindsey Palmer with breast cancer late 60's, no other breast CA, no ovarian, no colon CA. Nl pap Hx. HPV neg Nl mammograms    Patient's last menstrual period was 03/26/2024.       Patient's last menstrual period was 04/24/2024 (exact date).    Past Medical History:  Diagnosis Date   Anemia due to chronic blood loss    Eczema    H/O varicella    History of abnormal cervical Pap smear    History of malignant melanoma of back 2016   followed by dermatologist--- dr rocky pereyra (previous oncologist--- dr amadeo ronco in epic &released 01-31-2021);   dx 2016,  Stage IIIa, T2N1,  no mets, no adjuvant tx, no recurrence;   09-30-2015 s/p WLE melanoma left upper back w/ sln bx left axilla;  s/p left axilla node dissection 10-25-2015   Leiomyoma of uterus    intramural   Migraines    Phlebitis following infusion 05/04/2024   ED visit ----  dx superficial thrombophlebitis  to  right AC post blood transfusion given pain meds   Symptomatic anemia 04/30/2024   ED visit / admission 04-30-2024  Hg 5.5,  secondary to uterine bleeding;   x3  PRBC, x1 IV iron ,  x2 IV Premarin ;  discharged , Hg 8.5    Past Surgical History:  Procedure Laterality Date   ANKLE SURGERY Left 08/14/2008   [facility]   AXILLARY LYMPH NODE DISSECTION Left 10/25/2015   @[facility] by dr d. tommas;  complete left node dissection   DILATION AND EVACUATION  2010   for MAB   EXCISION MELANOMA WITH SENTINEL LYMPH NODE BIOPSY  09/30/2015   @ [facility] by dr d. tommas;   left upper back and left axilla node bx    Family History  Problem Relation Age of Onset   Migraines Mother    Migraines Brother    Cancer Paternal Grandfather        melanoma    Social History:  reports that she has never smoked. She has never used smokeless tobacco. She reports current alcohol use. She reports that she does not use drugs.  Allergies:  Allergies  Allergen Reactions   Chlorhexidine  Rash    HIBICLENS  DON'T NOT USE TO CLEANSE SURGICAL SITE   Cyanoacrylate Rash    SURGICAL GLUE = Severe rash     No medications prior to admission.    Review of Systems  Height 6' (1.829 m), weight 74.8 kg, last menstrual period 04/24/2024. Physical Exam Physical exam:  A&O x 3, no acute distress.  Pleasant HEENT neg, no thyromegaly Lungs CTA bilat CV RRR, S1S2 normal Abdo soft, non tender, non acute Extr no edema/ tenderness Pelvic uterus 12 wks bulky, no adnexal mass   Results for orders placed or performed during the hospital encounter of 05/27/24 (from the past 24 hours)  Basic metabolic panel     Status: Abnormal   Collection Time: 05/27/24  1:09 PM  Result Value Ref Range   Sodium 135 135 - 145 mmol/L   Potassium 3.6 3.5 - 5.1 mmol/L   Chloride 102 98 - 111 mmol/L   CO2 23 22 - 32 mmol/L   Glucose, Bld 104 (H) 70 - 99 mg/dL   BUN 14 6 - 20 mg/dL   Creatinine, Ser 9.23 0.44 - 1.00 mg/dL   Calcium  9.0 8.9 - 10.3 mg/dL   GFR, Estimated >39 >39 mL/min   Anion gap 10 5 - 15  CBC     Status: Abnormal   Collection Time: 05/27/24  1:09 PM  Result Value Ref Range    WBC 4.7 4.0 - 10.5 K/uL   RBC 3.40 (L) 3.87 - 5.11 MIL/uL   Hemoglobin 8.0 (L) 12.0 - 15.0 g/dL   HCT 73.3 (L) 63.9 - 53.9 %   MCV 78.2 (L) 80.0 - 100.0 fL   MCH 23.5 (L) 26.0 - 34.0 pg   MCHC 30.1 30.0 - 36.0 g/dL   RDW Not Measured 88.4 - 15.5 %   Platelets 308 150 - 400 K/uL   nRBC 0.0 0.0 - 0.2 %    No results found.  Assessment/Plan: 43 yo w/ fibroid uterus, menorrhagia, anemia, here for da vinci assisted total laparoscopic hysterectomy and bilateral salpingectomy.  Risks/complications of surgery reviewed incl infection, bleeding, damage to internal organs including bladder, bowels, ureters, blood vessels, other risks from anesthesia, VTE and delayed complications of any surgery, complications in future surgery reviewed.  Also d/w pt possible conversion to open TAH if any obvious concerns    Robbi JONELLE Render 05/27/2024, 9:08 PM

## 2024-05-28 ENCOUNTER — Encounter (HOSPITAL_COMMUNITY)
Admission: RE | Disposition: A | Discharge status: Home / Self Care | Payer: Self-pay | Source: Home / Self Care | Attending: Obstetrics & Gynecology

## 2024-05-28 ENCOUNTER — Encounter (HOSPITAL_COMMUNITY): Payer: Self-pay | Admitting: Obstetrics & Gynecology

## 2024-05-28 ENCOUNTER — Ambulatory Visit (HOSPITAL_COMMUNITY)
Admission: RE | Admit: 2024-05-28 | Discharge: 2024-05-29 | Disposition: A | Discharge status: Home / Self Care | Attending: Obstetrics & Gynecology | Admitting: Obstetrics & Gynecology

## 2024-05-28 ENCOUNTER — Ambulatory Visit (HOSPITAL_COMMUNITY): Payer: Self-pay | Admitting: Anesthesiology

## 2024-05-28 ENCOUNTER — Ambulatory Visit (HOSPITAL_BASED_OUTPATIENT_CLINIC_OR_DEPARTMENT_OTHER): Payer: Self-pay | Admitting: Anesthesiology

## 2024-05-28 ENCOUNTER — Other Ambulatory Visit: Payer: Self-pay

## 2024-05-28 DIAGNOSIS — K660 Peritoneal adhesions (postprocedural) (postinfection): Secondary | ICD-10-CM | POA: Diagnosis not present

## 2024-05-28 DIAGNOSIS — N8000 Endometriosis of the uterus, unspecified: Secondary | ICD-10-CM | POA: Diagnosis not present

## 2024-05-28 DIAGNOSIS — N888 Other specified noninflammatory disorders of cervix uteri: Secondary | ICD-10-CM | POA: Diagnosis not present

## 2024-05-28 DIAGNOSIS — D251 Intramural leiomyoma of uterus: Secondary | ICD-10-CM | POA: Diagnosis present

## 2024-05-28 DIAGNOSIS — D649 Anemia, unspecified: Secondary | ICD-10-CM | POA: Diagnosis not present

## 2024-05-28 DIAGNOSIS — N803 Endometriosis of pelvic peritoneum, unspecified: Secondary | ICD-10-CM | POA: Diagnosis not present

## 2024-05-28 DIAGNOSIS — N838 Other noninflammatory disorders of ovary, fallopian tube and broad ligament: Secondary | ICD-10-CM | POA: Insufficient documentation

## 2024-05-28 DIAGNOSIS — N736 Female pelvic peritoneal adhesions (postinfective): Secondary | ICD-10-CM | POA: Insufficient documentation

## 2024-05-28 DIAGNOSIS — Z90710 Acquired absence of both cervix and uterus: Secondary | ICD-10-CM | POA: Diagnosis present

## 2024-05-28 DIAGNOSIS — N921 Excessive and frequent menstruation with irregular cycle: Secondary | ICD-10-CM | POA: Insufficient documentation

## 2024-05-28 DIAGNOSIS — Z01818 Encounter for other preprocedural examination: Secondary | ICD-10-CM

## 2024-05-28 HISTORY — DX: Leiomyoma of uterus, unspecified: D25.9

## 2024-05-28 HISTORY — DX: Dermatitis, unspecified: L30.9

## 2024-05-28 HISTORY — PX: HYSTERECTOMY, TOTAL, LAPAROSCOPIC, ROBOT-ASSISTED WITH SALPINGECTOMY: SHX7587

## 2024-05-28 HISTORY — DX: Migraine, unspecified, not intractable, without status migrainosus: G43.909

## 2024-05-28 HISTORY — DX: Personal history of other diseases of the female genital tract: Z87.42

## 2024-05-28 HISTORY — DX: Iron deficiency anemia secondary to blood loss (chronic): D50.0

## 2024-05-28 HISTORY — PX: CYSTOSCOPY: SHX5120

## 2024-05-28 LAB — POCT PREGNANCY, URINE: Preg Test, Ur: NEGATIVE

## 2024-05-28 LAB — PREPARE RBC (CROSSMATCH)

## 2024-05-28 SURGERY — HYSTERECTOMY, TOTAL, LAPAROSCOPIC, ROBOT-ASSISTED WITH SALPINGECTOMY
Anesthesia: General | Site: Pelvis | Laterality: Bilateral

## 2024-05-28 MED ORDER — ROCURONIUM BROMIDE 10 MG/ML (PF) SYRINGE
PREFILLED_SYRINGE | INTRAVENOUS | Status: AC
  Filled 2024-05-28: qty 10

## 2024-05-28 MED ORDER — POVIDONE-IODINE 10 % EX SWAB: 2.0000 | Freq: Once | CUTANEOUS | Status: DC

## 2024-05-28 MED ORDER — MIDAZOLAM HCL 2 MG/2ML IJ SOLN
INTRAMUSCULAR | Status: DC | PRN
  Administered 2024-05-28: 2 mg via INTRAVENOUS

## 2024-05-28 MED ORDER — ACETAMINOPHEN 500 MG PO TABS
1000.0000 mg | ORAL_TABLET | Freq: Once | ORAL | Status: AC
  Administered 2024-05-28: 1000 mg via ORAL

## 2024-05-28 MED ORDER — CELECOXIB 200 MG PO CAPS
ORAL_CAPSULE | ORAL | Status: AC
  Filled 2024-05-28: qty 1

## 2024-05-28 MED ORDER — GABAPENTIN 300 MG PO CAPS
ORAL_CAPSULE | ORAL | Status: AC
Start: 2024-05-28 — End: 2024-05-28
  Filled 2024-05-28: qty 1

## 2024-05-28 MED ORDER — HYDROMORPHONE HCL 1 MG/ML IJ SOLN
0.2500 mg | INTRAMUSCULAR | Status: DC | PRN
  Administered 2024-05-28 (×3): 0.5 mg via INTRAVENOUS

## 2024-05-28 MED ORDER — ROCURONIUM BROMIDE 10 MG/ML (PF) SYRINGE
PREFILLED_SYRINGE | INTRAVENOUS | Status: DC | PRN
  Administered 2024-05-28 (×2): 20 mg via INTRAVENOUS
  Administered 2024-05-28: 60 mg via INTRAVENOUS
  Administered 2024-05-28: 20 mg via INTRAVENOUS

## 2024-05-28 MED ORDER — PROPOFOL 10 MG/ML IV BOLUS
INTRAVENOUS | Status: DC | PRN
  Administered 2024-05-28: 50 mg via INTRAVENOUS
  Administered 2024-05-28: 150 mg via INTRAVENOUS

## 2024-05-28 MED ORDER — ACETAMINOPHEN 325 MG PO TABS
650.0000 mg | ORAL_TABLET | ORAL | Status: DC | PRN
  Filled 2024-05-28 (×2): qty 2

## 2024-05-28 MED ORDER — OXYCODONE HCL 5 MG PO TABS: 5.0000 mg | ORAL_TABLET | Freq: Once | ORAL | Status: DC | PRN

## 2024-05-28 MED ORDER — ALBUMIN HUMAN 5 % IV SOLN: INTRAVENOUS | Status: DC | PRN

## 2024-05-28 MED ORDER — 0.9 % SODIUM CHLORIDE (POUR BTL) OPTIME
TOPICAL | Status: DC | PRN
Start: 2024-05-28 — End: 2024-05-28
  Administered 2024-05-28: 1000 mL

## 2024-05-28 MED ORDER — HYDROMORPHONE HCL 1 MG/ML IJ SOLN
INTRAMUSCULAR | Status: AC
  Filled 2024-05-28: qty 1

## 2024-05-28 MED ORDER — SODIUM CHLORIDE 0.9 % IV SOLN: INTRAVENOUS | Status: DC | PRN

## 2024-05-28 MED ORDER — SUGAMMADEX SODIUM 200 MG/2ML IV SOLN
INTRAVENOUS | Status: DC | PRN
  Administered 2024-05-28: 200 mg via INTRAVENOUS

## 2024-05-28 MED ORDER — ONDANSETRON HCL 4 MG/2ML IJ SOLN: 4.0000 mg | Freq: Once | INTRAMUSCULAR | Status: DC | PRN

## 2024-05-28 MED ORDER — LIDOCAINE 2% (20 MG/ML) 5 ML SYRINGE
INTRAMUSCULAR | Status: DC | PRN
  Administered 2024-05-28: 100 mg via INTRAVENOUS

## 2024-05-28 MED ORDER — HYDROMORPHONE HCL 2 MG PO TABS
2.0000 mg | ORAL_TABLET | Freq: Four times a day (QID) | ORAL | Status: DC | PRN
  Administered 2024-05-28 – 2024-05-29 (×4): 2 mg via ORAL
  Filled 2024-05-28 (×4): qty 1

## 2024-05-28 MED ORDER — ORAL CARE MOUTH RINSE: 15.0000 mL | Freq: Once | OROMUCOSAL | Status: AC

## 2024-05-28 MED ORDER — DEXAMETHASONE SODIUM PHOSPHATE 10 MG/ML IJ SOLN
INTRAMUSCULAR | Status: DC | PRN
  Administered 2024-05-28: 10 mg via INTRAVENOUS

## 2024-05-28 MED ORDER — OXYCODONE HCL 5 MG PO TABS: 5.0000 mg | ORAL_TABLET | Freq: Four times a day (QID) | ORAL | Status: DC | PRN

## 2024-05-28 MED ORDER — ONDANSETRON HCL 4 MG/2ML IJ SOLN: 4.0000 mg | Freq: Four times a day (QID) | INTRAMUSCULAR | Status: DC | PRN

## 2024-05-28 MED ORDER — CHLORHEXIDINE GLUCONATE 0.12 % MT SOLN
OROMUCOSAL | Status: AC
  Filled 2024-05-28: qty 15

## 2024-05-28 MED ORDER — ONDANSETRON HCL 4 MG/2ML IJ SOLN
INTRAMUSCULAR | Status: DC | PRN
  Administered 2024-05-28: 4 mg via INTRAVENOUS

## 2024-05-28 MED ORDER — SODIUM CHLORIDE 0.9 % IR SOLN
Status: DC | PRN
  Administered 2024-05-28 (×2): 1000 mL

## 2024-05-28 MED ORDER — LACTATED RINGERS IV SOLN: INTRAVENOUS | Status: DC | PRN

## 2024-05-28 MED ORDER — CELECOXIB 200 MG PO CAPS
200.0000 mg | ORAL_CAPSULE | Freq: Once | ORAL | Status: AC
  Administered 2024-05-28: 200 mg via ORAL

## 2024-05-28 MED ORDER — SODIUM CHLORIDE (PF) 0.9 % IJ SOLN
INTRAMUSCULAR | Status: AC
  Filled 2024-05-28: qty 50

## 2024-05-28 MED ORDER — ROPIVACAINE HCL 5 MG/ML IJ SOLN
INTRAMUSCULAR | Status: AC
  Filled 2024-05-28: qty 30

## 2024-05-28 MED ORDER — CEFAZOLIN SODIUM-DEXTROSE 2-4 GM/100ML-% IV SOLN
INTRAVENOUS | Status: AC
  Filled 2024-05-28: qty 100

## 2024-05-28 MED ORDER — FENTANYL CITRATE (PF) 100 MCG/2ML IJ SOLN
INTRAMUSCULAR | Status: AC
  Filled 2024-05-28: qty 2

## 2024-05-28 MED ORDER — GABAPENTIN 300 MG PO CAPS
300.0000 mg | ORAL_CAPSULE | ORAL | Status: AC
  Administered 2024-05-28: 300 mg via ORAL

## 2024-05-28 MED ORDER — CHLORHEXIDINE GLUCONATE 0.12 % MT SOLN
15.0000 mL | Freq: Once | OROMUCOSAL | Status: AC
  Administered 2024-05-28: 15 mL via OROMUCOSAL

## 2024-05-28 MED ORDER — FENTANYL CITRATE (PF) 100 MCG/2ML IJ SOLN
25.0000 ug | INTRAMUSCULAR | Status: DC | PRN
  Administered 2024-05-28: 25 ug via INTRAVENOUS
  Administered 2024-05-28: 50 ug via INTRAVENOUS

## 2024-05-28 MED ORDER — HEMOSTATIC AGENTS (NO CHARGE) OPTIME
TOPICAL | Status: DC | PRN
Start: 2024-05-28 — End: 2024-05-28
  Administered 2024-05-28: 1

## 2024-05-28 MED ORDER — MEPERIDINE HCL 25 MG/ML IJ SOLN: 6.2500 mg | INTRAMUSCULAR | Status: DC | PRN

## 2024-05-28 MED ORDER — SIMETHICONE 80 MG PO CHEW
80.0000 mg | CHEWABLE_TABLET | Freq: Four times a day (QID) | ORAL | Status: DC | PRN
  Administered 2024-05-29: 80 mg via ORAL
  Filled 2024-05-28: qty 1

## 2024-05-28 MED ORDER — SODIUM CHLORIDE (PF) 0.9 % IJ SOLN
INTRAMUSCULAR | Status: DC | PRN
  Administered 2024-05-28: 45 mL
  Administered 2024-05-28: 15 mL

## 2024-05-28 MED ORDER — LACTATED RINGERS IV SOLN: INTRAVENOUS | Status: DC

## 2024-05-28 MED ORDER — OXYCODONE HCL 5 MG/5ML PO SOLN: 5.0000 mg | Freq: Once | ORAL | Status: DC | PRN

## 2024-05-28 MED ORDER — PROPOFOL 10 MG/ML IV BOLUS
INTRAVENOUS | Status: AC
Start: 2024-05-28 — End: 2024-05-28
  Filled 2024-05-28: qty 20

## 2024-05-28 MED ORDER — MIDAZOLAM HCL 2 MG/2ML IJ SOLN
INTRAMUSCULAR | Status: AC
  Filled 2024-05-28: qty 2

## 2024-05-28 MED ORDER — FENTANYL CITRATE (PF) 250 MCG/5ML IJ SOLN
INTRAMUSCULAR | Status: DC | PRN
  Administered 2024-05-28 (×5): 50 ug via INTRAVENOUS

## 2024-05-28 MED ORDER — MENTHOL 3 MG MT LOZG: 1.0000 | LOZENGE | OROMUCOSAL | Status: DC | PRN

## 2024-05-28 MED ORDER — CEFAZOLIN SODIUM-DEXTROSE 2-4 GM/100ML-% IV SOLN
2.0000 g | INTRAVENOUS | Status: AC
  Administered 2024-05-28: 2 g via INTRAVENOUS

## 2024-05-28 MED ORDER — ONDANSETRON HCL 4 MG PO TABS: 4.0000 mg | ORAL_TABLET | Freq: Four times a day (QID) | ORAL | Status: DC | PRN

## 2024-05-28 MED ORDER — ACETAMINOPHEN 500 MG PO TABS
ORAL_TABLET | ORAL | Status: AC
  Administered 2024-05-28: 650 mg via ORAL
  Filled 2024-05-28: qty 2

## 2024-05-28 MED ORDER — FENTANYL CITRATE (PF) 250 MCG/5ML IJ SOLN
INTRAMUSCULAR | Status: AC
  Filled 2024-05-28: qty 5

## 2024-05-28 MED ORDER — WHITE PETROLATUM EX OINT
TOPICAL_OINTMENT | CUTANEOUS | Status: AC
  Filled 2024-05-28: qty 28.35

## 2024-05-28 SURGICAL SUPPLY — 50 items
APPLICATOR ARISTA FLEXITIP XL (MISCELLANEOUS) IMPLANT
BARRIER ADHS 3X4 INTERCEED (GAUZE/BANDAGES/DRESSINGS) IMPLANT
CATH FOLEY 3WAY 5CC 16FR (CATHETERS) IMPLANT
COVER BACK TABLE 60X90IN (DRAPES) ×2 IMPLANT
COVER TIP SHEARS 8 DVNC (MISCELLANEOUS) ×2 IMPLANT
DEFOGGER SCOPE WARM SEASHARP (MISCELLANEOUS) ×2 IMPLANT
DERMABOND ADVANCED .7 DNX12 (GAUZE/BANDAGES/DRESSINGS) ×2 IMPLANT
DRAPE ARM DVNC X/XI (DISPOSABLE) ×8 IMPLANT
DRAPE COLUMN DVNC XI (DISPOSABLE) ×2 IMPLANT
DRAPE SURG IRRIG POUCH 19X23 (DRAPES) ×2 IMPLANT
DRAPE UTILITY XL STRL (DRAPES) ×2 IMPLANT
DRIVER NDL MEGA SUTCUT DVNCXI (INSTRUMENTS) ×2 IMPLANT
DRIVER NDLE MEGA SUTCUT DVNCXI (INSTRUMENTS) ×2 IMPLANT
DURAPREP 26ML APPLICATOR (WOUND CARE) ×2 IMPLANT
ELECTRODE REM PT RTRN 9FT ADLT (ELECTROSURGICAL) ×2 IMPLANT
FORCEPS BPLR 8 MD DVNC XI (FORCEP) ×2 IMPLANT
FORCEPS BPLR LNG DVNC XI (INSTRUMENTS) ×2 IMPLANT
FORCEPS PROGRASP DVNC XI (FORCEP) ×2 IMPLANT
GAUZE 4X4 16PLY ~~LOC~~+RFID DBL (SPONGE) ×2 IMPLANT
GAUZE PETROLATUM 1 X8 (GAUZE/BANDAGES/DRESSINGS) IMPLANT
GLOVE BIO SURGEON STRL SZ7 (GLOVE) ×6 IMPLANT
GLOVE BIOGEL PI IND STRL 7.0 (GLOVE) ×10 IMPLANT
HEMOSTAT ARISTA ABSORB 3G PWDR (HEMOSTASIS) IMPLANT
IRRIGATION STRYKERFLOW (MISCELLANEOUS) ×2 IMPLANT
IV NS 1000ML BAXH (IV SOLUTION) IMPLANT
KIT PINK PAD W/HEAD ARM REST (MISCELLANEOUS) ×4 IMPLANT
LEGGING LITHOTOMY PAIR STRL (DRAPES) ×2 IMPLANT
OBTURATOR OPTICALSTD 8 DVNC (TROCAR) ×2 IMPLANT
OCCLUDER COLPOPNEUMO (BALLOONS) ×2 IMPLANT
PACK ROBOT WH (CUSTOM PROCEDURE TRAY) ×2 IMPLANT
PACK ROBOTIC GOWN (GOWN DISPOSABLE) ×2 IMPLANT
PAD OB MATERNITY 11 LF (PERSONAL CARE ITEMS) ×2 IMPLANT
SCISSORS MNPLR CVD DVNC XI (INSTRUMENTS) ×2 IMPLANT
SEAL UNIV 5-12 XI (MISCELLANEOUS) ×6 IMPLANT
SEALER VESSEL EXT DVNC XI (MISCELLANEOUS) ×2 IMPLANT
SET IRRIG Y TYPE TUR BLADDER L (SET/KITS/TRAYS/PACK) IMPLANT
SET TRI-LUMEN FLTR TB AIRSEAL (TUBING) ×2 IMPLANT
SPIKE FLUID TRANSFER (MISCELLANEOUS) ×4 IMPLANT
STRIP CLOSURE SKIN 1/2X4 (GAUZE/BANDAGES/DRESSINGS) IMPLANT
SUT VIC AB 4-0 PS2 18 (SUTURE) ×4 IMPLANT
SUT VICRYL 0 UR6 27IN ABS (SUTURE) ×2 IMPLANT
SUT VLOC 180 0 9IN GS21 (SUTURE) ×2 IMPLANT
TIP RUMI ORANGE 6.7MMX12CM (TIP) IMPLANT
TIP UTERINE 6.7X10CM GRN DISP (MISCELLANEOUS) IMPLANT
TIP UTERINE 6.7X8CM BLUE DISP (MISCELLANEOUS) IMPLANT
TOWEL GREEN STERILE (TOWEL DISPOSABLE) ×2 IMPLANT
TRAY FOL W/BAG SLVR 16FR STRL (SET/KITS/TRAYS/PACK) IMPLANT
TROCAR PORT AIRSEAL 5X120 (TROCAR) ×2 IMPLANT
UNDERPAD 30X36 HEAVY ABSORB (UNDERPADS AND DIAPERS) ×2 IMPLANT
WATER STERILE IRR 1000ML POUR (IV SOLUTION) ×2 IMPLANT

## 2024-05-28 NOTE — Transfer of Care (Signed)
 Immediate Anesthesia Transfer of Care Note  Patient: Lindsey Palmer  Procedure(s) Performed: HYSTERECTOMY, TOTAL, LAPAROSCOPIC, ROBOT-ASSISTED WITH SALPINGECTOMY (Bilateral: Pelvis) CYSTOSCOPY (Bladder)  Patient Location: PACU  Anesthesia Type:General  Level of Consciousness: awake, alert , oriented, patient cooperative, and responds to stimulation  Airway & Oxygen Therapy: Patient Spontanous Breathing and Patient connected to face mask oxygen  Post-op Assessment: Report given to RN, Post -op Vital signs reviewed and stable, and Patient moving all extremities X 4  Post vital signs: Reviewed and stable  Last Vitals:  Vitals Value Taken Time  BP    Temp    Pulse 91 05/28/24 14:38  Resp 13 05/28/24 14:38  SpO2 100 % 05/28/24 14:38  Vitals shown include unfiled device data.  Last Pain:  Vitals:   05/28/24 0832  TempSrc: Oral  PainSc: 2          Complications: No notable events documented.

## 2024-05-28 NOTE — Anesthesia Preprocedure Evaluation (Addendum)
 Anesthesia Evaluation  Patient identified by MRN, date of birth, ID band Patient awake    Reviewed: Allergy & Precautions, H&P , NPO status , Patient's Chart, lab work & pertinent test results  Airway Mallampati: II  TM Distance: >3 FB Neck ROM: Full    Dental no notable dental hx. (+) Teeth Intact, Dental Advisory Given   Pulmonary neg pulmonary ROS   Pulmonary exam normal breath sounds clear to auscultation       Cardiovascular Exercise Tolerance: Good negative cardio ROS Normal cardiovascular exam Rhythm:Regular Rate:Normal     Neuro/Psych negative neurological ROS  negative psych ROS   GI/Hepatic negative GI ROS, Neg liver ROS,,,  Endo/Other  negative endocrine ROS    Renal/GU negative Renal ROS  negative genitourinary   Musculoskeletal negative musculoskeletal ROS (+)    Abdominal   Peds negative pediatric ROS (+)  Hematology negative hematology ROS (+) Blood dyscrasia, anemia   Anesthesia Other Findings   Reproductive/Obstetrics negative OB ROS                              Anesthesia Physical Anesthesia Plan  ASA: 2  Anesthesia Plan: General   Post-op Pain Management: Tylenol  PO (pre-op)*, Celebrex  PO (pre-op)* and Gabapentin  PO (pre-op)*   Induction: Intravenous  PONV Risk Score and Plan: 3 and Ondansetron , Dexamethasone  and Treatment may vary due to age or medical condition  Airway Management Planned: Oral ETT  Additional Equipment: None  Intra-op Plan:   Post-operative Plan: Extubation in OR  Informed Consent: I have reviewed the patients History and Physical, chart, labs and discussed the procedure including the risks, benefits and alternatives for the proposed anesthesia with the patient or authorized representative who has indicated his/her understanding and acceptance.       Plan Discussed with: Anesthesiologist  Anesthesia Plan Comments: (  )          Anesthesia Quick Evaluation

## 2024-05-28 NOTE — Op Note (Signed)
 05/28/2024  Lindsey Palmer, Lindsey Palmer Preoperative diagnosis:  Menometrorrhagia, severe anemia, fibroid uterus  Postop diagnosis: Same and Pelvic Endometriosis  Procedure:  da Vinci robot assisted total laparoscopic hysterectomy, bilateral salpingectomy Cystoscopy  Anesthesia Gen. Endotracheal Surgeon: Dr. Robbi Render Assistant: Daine Abbot RNFA  IV fluids: 800cc LR + 500 cc Albumin  EBL: 30 cc Urine output: 400 cc, clear in foley before cystoscopy  Complications: none Pathology: Uterus with cervix and both fallopian tubes Disposition: PACU, stable  Findings: Enlarged uterus. Pelvic adhesions. Endometriosis, left side worse than right. Left ureter not seen well. Hence cystoscopy was performed.  Cystoscopy- Left ureter immediate urine jet noted. Right ureter delayed but normal ureteral jet noted   Procedure:  Complications of surgery including infection, bleeding, damage to internal organs and other surgery related problems including pneumonia, VTE reviewed and informed written consent was obtained. Patient gave informed written consent.  Patient was brought to the operating room with IV running. She received 2 gm Ancef . She underwent general anesthesia without difficulty and was given dorsal lithotomy position, prepped and draped in sterile fashion. Foley catheter was placed. Cervix was exposed with a speculum and anterior lip of the cervix was grasped with tenaculum. Uterus was sounded to 16 cm. A  # 12 Rumi tip and medium Koh ring was assembled on the [employer] and entered in the uterine cavity and balloon was inflated to secure it in place. Koh ring was palpated again cervico-vaginal junction. Speculum was removed, tenaculum was left on the cervix.  Attention was focused on abdomen. Supraumbilical 10 mm vertical incision made with scalpel after injecting Marcaine , fascia dissected, grasped with Kocher's and incised, posterior rectus sheath and peritoneum grasped, incised,  intraabdominal entry confirmed. Purse string stay stitch on 0-Vicryl taken on fascia and Robot cannula with occluder introduced and Vicryl sutures secured. Pneumoperitoneum was begun. Laparoscope was introduced and the peritoneal cavity was evaluated.  Port sited marked and injected with Marcaine . Two Robotic cannulas inserted on right side and one on the left side under vision and one assist port Airflow inserted in the left,  Robot was docked from the left. Arm 1 had Bipolar, arm 2 camera, arm 3 endoscissors and arm 4 Prograsp, energy sources attached. Dr. Render scrubbed out and went for surgical console.  Uterus was large. Endometriosis noted, worse in left, Colon adhesions noted. Tubes normal. Right ureter seen well, left not seen.  Uterus was deviated to the patient's right. Colon adhesions to side wall cut with cold scissors. Left mesosalpinx desiccated and cut, tube kept attached to uterus. Left ovary adherent to ovarian fossa. Endometrioma drained while releasing ovarian adhesions and freeing up left utero-ovarian ligament. Utero-ovarian ligament was desiccated and cut. Left round ligament was desiccated and cut. Anterior broad opened, to start bladder flap.Tissue was poor, tearing easily. Posterior broad ligament incised up to the uterosacral ligament and left uterine vessels skeletonized. Uterus was deviated to the left and right tube grasped and mesosalpinx desiccated and cut, tube kept attached to uterus. Peri-ovarian adhesions released. Right utero-ovarian ligament desiccated and incised followed by broad ligament and right Round ligament. Anterior broad ligament was incised to create bladder flap, bladder was pushed away by blunt and sharp dissection with excellent hemostasis. Koh ring impression at cervicovaginal junction was seen well anteriorly. Right posterior broad ligament dissected, right Uterine vessels skeletonized. Right uterine vessels were desiccated and cut. Uterus was deviated to the  right and the left uterine vessels were desiccated and cut. Vaginal occluder was inflated. Colpotomy was begun  starting from midline anteriorly coming to the left and right and then circumferentially staying above the uterosacral ligaments posteriorly. Dr Barbette scrubbed back for tissue retrieval from vagina, removed intact by gradually pulling down with series of tenaculum bites to pull down the uterus. Uterus, cervix, both tubes were pulled out of the vaginal opening and vaginal occluder placed. vaginal cut edges were evaluated for hemostasis which was excellent. Irrigation was performed pedicles appeared dry. Robotic instruments switched for needle driver in arm 3. 0 V-lock used for suturing vaginal cuff. Arista sprayed. Hemostasis noted.   Left ureter not seen well. Right seen well and peristasis noted. So cystoscopy performed. 300 cc filled. Bladder with no complications left ureteral jet noted immediately. Right was delayed but noted to be normal. Cystoscope removed,   Robotic instruments were removed. Robot was undocked. Patient was made supine. Lap'scope was reintroduced, hemostasis was excellent. All remaining ropivacaine  instilled in peritoneum. Ports removed. The stay sutures at the fascia tied together with excellent fascial closure. Skin approximated with subcuticular stitches on 4-0 Vicryl. Dermabond was applied. No vaginal bleeding noted on vaginal exam at the end. All counts were correct x2.  No complications. Patient tolerated procedure well and was reversed from anesthesia and brought to the PACU stable condition. Foley catheter to be removed in PACU.  Dr Barbette was the surgeon for entire case.

## 2024-05-28 NOTE — Anesthesia Postprocedure Evaluation (Signed)
 Anesthesia Post Note  Patient: Lindsey Palmer  Procedure(s) Performed: HYSTERECTOMY, TOTAL, LAPAROSCOPIC, ROBOT-ASSISTED WITH SALPINGECTOMY (Bilateral: Pelvis) CYSTOSCOPY (Bladder)     Anesthesia Type: General Anesthetic complications: no   No notable events documented.  Last Vitals:  Vitals:   05/28/24 1445 05/28/24 1500  BP: 123/77 126/81  Pulse: 90 86  Resp: (!) 9 14  Temp:    SpO2: 100% 100%    Last Pain:  Vitals:   05/28/24 1506  TempSrc:   PainSc: 6                  [name]

## 2024-05-29 ENCOUNTER — Encounter (HOSPITAL_COMMUNITY): Payer: Self-pay | Admitting: Obstetrics & Gynecology

## 2024-05-29 ENCOUNTER — Other Ambulatory Visit (HOSPITAL_COMMUNITY): Payer: Self-pay

## 2024-05-29 ENCOUNTER — Other Ambulatory Visit: Payer: Self-pay

## 2024-05-29 ENCOUNTER — Ambulatory Visit (HOSPITAL_COMMUNITY)

## 2024-05-29 DIAGNOSIS — N921 Excessive and frequent menstruation with irregular cycle: Secondary | ICD-10-CM | POA: Diagnosis not present

## 2024-05-29 LAB — CBC
HCT: 22.6 % — ABNORMAL LOW (ref 36.0–46.0)
Hemoglobin: 7 g/dL — ABNORMAL LOW (ref 12.0–15.0)
MCH: 24.2 pg — ABNORMAL LOW (ref 26.0–34.0)
MCHC: 31 g/dL (ref 30.0–36.0)
MCV: 78.2 fL — ABNORMAL LOW (ref 80.0–100.0)
Platelets: 275 K/uL (ref 150–400)
RBC: 2.89 MIL/uL — ABNORMAL LOW (ref 3.87–5.11)
WBC: 8.3 K/uL (ref 4.0–10.5)
nRBC: 0 % (ref 0.0–0.2)

## 2024-05-29 LAB — PREPARE RBC (CROSSMATCH)

## 2024-05-29 MED ORDER — IBUPROFEN 200 MG PO TABS
600.0000 mg | ORAL_TABLET | Freq: Four times a day (QID) | ORAL | 0 refills | Status: AC | PRN
  Filled 2024-05-29 (×2): qty 30, 3d supply, fill #0

## 2024-05-29 MED ORDER — GABAPENTIN 300 MG PO CAPS
300.0000 mg | ORAL_CAPSULE | Freq: Three times a day (TID) | ORAL | Status: DC
  Administered 2024-05-29 (×3): 300 mg via ORAL
  Filled 2024-05-29 (×3): qty 1

## 2024-05-29 MED ORDER — HYDROMORPHONE HCL 2 MG PO TABS
2.0000 mg | ORAL_TABLET | Freq: Four times a day (QID) | ORAL | 0 refills | Status: AC | PRN
  Filled 2024-05-29: qty 30, 7d supply, fill #0
  Filled 2024-05-29: qty 30, 8d supply, fill #0

## 2024-05-29 MED ORDER — ACETAMINOPHEN 325 MG PO TABS: 650.0000 mg | ORAL_TABLET | ORAL | Status: AC | PRN

## 2024-05-29 MED ORDER — HYDROMORPHONE HCL 1 MG/ML IJ SOLN
1.0000 mg | Freq: Once | INTRAMUSCULAR | Status: DC
  Filled 2024-05-29: qty 1

## 2024-05-29 MED ORDER — SIMETHICONE 80 MG PO CHEW
80.0000 mg | CHEWABLE_TABLET | Freq: Four times a day (QID) | ORAL | 0 refills | Status: AC | PRN
  Filled 2024-05-29: qty 30, 8d supply, fill #0
  Filled 2024-05-29: qty 20, 5d supply, fill #0

## 2024-05-29 MED ORDER — SODIUM CHLORIDE 0.9% IV SOLUTION: Freq: Once | INTRAVENOUS | Status: AC

## 2024-05-29 NOTE — Progress Notes (Addendum)
 Patient ID: Lindsey Palmer, female   DOB: 27-Feb-1981, 43 y.o.   MRN: [id number] Pt seen again.  Sitting up in bed, eye pad on and listening to calming music. Mother in room.  Pt declined CT as she could not lie flat due to shoulder pain and back pain.  She has eaten another regular diet meal without n/v  She has ambulated in hall after blood transfusion and dizziness has resolved. HA resolved w/ Maxalt  she had on her.   BP (!) 114/56   Pulse 86   Temp 98.5 F (36.9 C) (Oral)   Resp 18   Ht 6' (1.829 m)   Wt 77.1 kg   LMP 04/24/2024 (Exact Date)   SpO2 99%   BMI 23.06 kg/m  Orthostats reviewed Appears well Lungs cta B/L CV RRR Abdo mild guarding but non acute, NABS  No vag bleeding per pt  Voids normal   Repeat CBC deferred  D/w laparoscopic pneumo residual Co2 trapping causing pain and there are no surgical concerns.  CT was never indicated but requested due to her and her husband's concerns and I am happy to cancel it   Discharge home Post op care and warning ss dw pt See me in office in 2 wks  After-hr contact is office line   --Robbi Render MD

## 2024-05-29 NOTE — Progress Notes (Signed)
 1 Day Post-Op Procedure(s) (LRB): HYSTERECTOMY, TOTAL, LAPAROSCOPIC, ROBOT-ASSISTED WITH SALPINGECTOMY (Bilateral) CYSTOSCOPY  Subjective: Patient reports difficulty with deep breaths. Was having before surgery, now worse. Cannot lie down, belly hurts. Ate 1/2 hamburger last night with no nausea/ vomiting. Able to void normally. No vag bleeding. Some dizziness. CBC ordered this AM as she may be lower than 8. No CP but has shoulder pain. .    Objective: I have reviewed patient's vital signs, intake and output, and medications. BP 120/63 (BP Location: Right Arm)   Pulse 67   Temp 98 F (36.7 C) (Oral)   Resp 15   Ht 6' (1.829 m)   Wt 77.1 kg   LMP 04/24/2024 (Exact Date)   SpO2 99%   BMI 23.06 kg/m  Sitting up in distress, husband helping to sit erect or slightly lie back for exam  General: cooperative and appears stated age Resp: clear to auscultation bilaterally Cardio: regular rate and rhythm, S1, S2 normal, no murmur, click, rub or gallop GI: voluntary guarding but soft. Incisions dry. No distention. NABS  Extremities: extremities normal, atraumatic, no cyanosis or edema Vaginal Bleeding: none  Assessment: s/p Procedure(s): HYSTERECTOMY, TOTAL, LAPAROSCOPIC, ROBOT-ASSISTED WITH SALPINGECTOMY (Bilateral) CYSTOSCOPY: CBC for this AM stat ordered  Normal GI function and voiding and no bleeding.  SOB/ can't catch breath or hurts to take breath and cannot lie back- O2 sat normal at RA and vitals nl. Likely from pneumoperitoneum but due to her being worried, we can have chest CT and assess r/o PE since not moving much following severe anemia and had IV premarin  to stop bleeding. I dont feel acute surgical belly by exam and vitals.     Latest Ref Rng & Units 05/29/2024    7:14 AM 05/27/2024    1:09 PM 05/04/2024    6:12 PM  CBC  WBC 4.0 - 10.5 K/uL 8.3  4.7  9.8   Hemoglobin 12.0 - 15.0 g/dL 7.0  8.0  89.7   Hematocrit 36.0 - 46.0 % 22.6  26.6  34.1   Platelets 150 - 400 K/uL 275   308  401     H/H dropped but she has been bleeding since May and post op changes expected as she was bleeding even yesterday. We had deferred blood but now proceed with 1 unit pRBC Ambulate, incentive spirometry q 2 hrs  [place],etc for bowel management  Reassess after blood and CT    Robbi JONELLE Render, MD 05/29/2024, 7:05 AM

## 2024-05-29 NOTE — Discharge Summary (Signed)
 Physician Discharge Summary  Patient ID: Lindsey Palmer MRN: [id number] DOB/AGE: [birth year] 43 y.o.  Admit date: 05/28/2024 Discharge date: 05/29/2024  Admission Diagnoses: Menometrorrhagia, severe anemia, fibroid uterus   Discharge Diagnoses:  Menometrorrhagia, severe anemia, fibroid uterus, pelvic endometriosis Status post - da Vinci robot assisted total laparoscopic hysterectomy, bilateral salpingectomy and Cystoscopy  Discharged Condition: good  Hospital Course: Patient was admitted overnight for observation and pain control. She had a lot of chest and shoulder pain from residual pneumoperitoneum. She received one unit pRBCs for Hgb 7 that was expected to be low. She was sitting up in bed with no pain or distress. Chest CT was ordered per her request but she declined CT as she could not lie flat due to shoulder pain and back pain.  She has eaten another regular diet meal without n/v. She has ambulated in hall after blood transfusion and dizziness has resolved. HA resolved w/ Maxalt  she had on her.    BP (!) 114/56   Pulse 86   Temp 98.5 F (36.9 C) (Oral)   Resp 18   Ht 6' (1.829 m)   Wt 77.1 kg   LMP 04/24/2024 (Exact Date)   SpO2 99%   BMI 23.06 kg/m  Orthostats reviewed Appears well Lungs cta B/L CV RRR Abdo mild guarding but non acute, NABS  No vag bleeding per pt  Voids normal   D/w laparoscopic pneumo residual Co2 trapping causing pain and there are no surgical concerns.  CT was never indicated but requested due to her and her husband's concerns and I am happy to cancel it    Discharge home Post op care and warning ss dw pt See me in office in 2 wks  After-hr contact is office line    Disposition: Discharge disposition: 01-Home or Self Care       Discharge Instructions     Call MD for:   Complete by: As directed    Vaginal bleeding   Call MD for:  difficulty breathing, headache or visual disturbances   Complete by: As directed    Call MD for:  extreme  fatigue   Complete by: As directed    Call MD for:  hives   Complete by: As directed    Call MD for:  persistant dizziness or light-headedness   Complete by: As directed    Call MD for:  persistant nausea and vomiting   Complete by: As directed    Call MD for:  redness, tenderness, or signs of infection (pain, swelling, redness, odor or green/yellow discharge around incision site)   Complete by: As directed    Call MD for:  severe uncontrolled pain   Complete by: As directed    Call MD for:  temperature >100.4   Complete by: As directed    Diet - low sodium heart healthy   Complete by: As directed    Driving Restrictions   Complete by: As directed    At least 2 weeks, more as needed   Increase activity slowly   Complete by: As directed    Lifting restrictions   Complete by: As directed    10 pounds or less for 8 weeks   No dressing needed   Complete by: As directed    Sexual Activity Restrictions   Complete by: As directed    8 weeks      Allergies as of 05/29/2024       Reactions   Chlorhexidine  Rash   HIBICLENS  DON'T NOT USE TO  CLEANSE SURGICAL SITE   Cyanoacrylate Rash   SURGICAL GLUE = Severe rash         Medication List     TAKE these medications    acetaminophen  500 MG tablet Commonly known as: TYLENOL  Take 1,000 mg by mouth every 6 (six) hours as needed for headache, fever, moderate pain (pain score 4-6) or mild pain (pain score 1-3). What changed: Another medication with the same name was added. Make sure you understand how and when to take each.   acetaminophen  325 MG tablet Commonly known as: TYLENOL  Take 2 tablets (650 mg total) by mouth every 4 (four) hours as needed for mild pain (pain score 1-3) or fever (temperature > 101.5.). What changed: You were already taking a medication with the same name, and this prescription was added. Make sure you understand how and when to take each.   CoQ-10 100 MG Caps Take 1 capsule by mouth daily.   ferrous  sulfate 325 (65 FE) MG EC tablet Take 325 mg by mouth daily.   HYDROmorphone  2 MG tablet Commonly known as: DILAUDID  Take 1 tablet (2 mg total) by mouth every 6 (six) hours as needed for severe pain (pain score 7-10).   ibuprofen  600 MG tablet Commonly known as: ADVIL  Take 1 tablet (600 mg total) by mouth every 6 (six) hours as needed. What changed: Another medication with the same name was added. Make sure you understand how and when to take each.   ibuprofen  200 MG tablet Commonly known as: Advil  Take 3 tablets (600 mg total) by mouth every 6 (six) hours as needed. What changed: You were already taking a medication with the same name, and this prescription was added. Make sure you understand how and when to take each.   magnesium oxide 400 (240 Mg) MG tablet Commonly known as: MAG-OX Take 400 mg by mouth daily.   Multivitamin Adults Tabs Take 1 tablet by mouth daily.   rizatriptan  10 MG tablet Commonly known as: MAXALT  Take 10 mg by mouth as needed for migraine. May repeat in 2 hours if needed   simethicone  80 MG chewable tablet Commonly known as: MYLICON Chew 1 tablet (80 mg total) by mouth 4 (four) times daily as needed for flatulence.   Vitamin D3 125 MCG (5000 UT) Caps Take 1 capsule by mouth daily.   Vitamin K2 100 MCG Caps Take 1 capsule by mouth daily.               Discharge Care Instructions  (From admission, onward)           Start     Ordered   05/29/24 0000  No dressing needed        05/29/24 1516            Follow-up Information     Barbette Knock, MD Follow up in 2 week(s).   Specialty: Obstetrics and Gynecology Contact information: [address] [place] [address] [phone]                 Signed: Knock JONELLE Barbette 05/29/2024, 3:20 PM

## 2024-05-30 LAB — TYPE AND SCREEN
ABO/RH(D): A POS
Antibody Screen: POSITIVE
Donor AG Type: NEGATIVE
Donor AG Type: NEGATIVE
Unit division: 0
Unit division: 0

## 2024-05-30 LAB — BPAM RBC
Blood Product Expiration Date: 202508172359
Blood Product Expiration Date: 202508182359
ISSUE DATE / TIME: 202507180959
Unit Type and Rh: 6200
Unit Type and Rh: 6200

## 2024-06-01 LAB — SURGICAL PATHOLOGY

## 2024-06-09 ENCOUNTER — Other Ambulatory Visit: Payer: Self-pay

## 2024-06-09 ENCOUNTER — Other Ambulatory Visit (HOSPITAL_COMMUNITY): Payer: Self-pay

## 2024-06-09 MED ORDER — RIZATRIPTAN BENZOATE 10 MG PO TBDP
ORAL_TABLET | ORAL | 3 refills | Status: DC
  Filled 2024-06-09: qty 12, 30d supply, fill #0
  Filled 2024-09-09: qty 12, 30d supply, fill #1
  Filled 2024-10-15: qty 12, 30d supply, fill #2
  Filled 2024-11-24: qty 12, 30d supply, fill #3

## 2024-08-06 ENCOUNTER — Other Ambulatory Visit: Payer: Self-pay

## 2024-08-06 ENCOUNTER — Other Ambulatory Visit (HOSPITAL_COMMUNITY): Payer: Self-pay

## 2024-08-06 MED ORDER — ESTRADIOL 0.1 MG/GM VA CREA
1.0000 g | TOPICAL_CREAM | VAGINAL | 3 refills | Status: AC
  Filled 2024-08-06: qty 42.5, 90d supply, fill #0

## 2024-09-09 ENCOUNTER — Other Ambulatory Visit: Payer: Self-pay

## 2024-10-15 ENCOUNTER — Other Ambulatory Visit (HOSPITAL_COMMUNITY): Payer: Self-pay

## 2024-11-24 ENCOUNTER — Other Ambulatory Visit (HOSPITAL_COMMUNITY): Payer: Self-pay

## 2024-12-17 ENCOUNTER — Other Ambulatory Visit (HOSPITAL_COMMUNITY): Payer: Self-pay

## 2024-12-18 ENCOUNTER — Other Ambulatory Visit: Payer: Self-pay

## 2024-12-18 ENCOUNTER — Other Ambulatory Visit (HOSPITAL_BASED_OUTPATIENT_CLINIC_OR_DEPARTMENT_OTHER): Payer: Self-pay

## 2024-12-18 ENCOUNTER — Other Ambulatory Visit (HOSPITAL_COMMUNITY): Payer: Self-pay

## 2024-12-18 MED ORDER — RIZATRIPTAN BENZOATE 10 MG PO TBDP
10.0000 mg | ORAL_TABLET | Freq: Every day | ORAL | 3 refills | Status: AC | PRN
  Filled 2024-12-18: qty 12, 30d supply, fill #0
